# Patient Record
Sex: Male | Born: 1965 | Race: White | Hispanic: No | Marital: Married | Smoking: Current every day smoker
Health system: Southern US, Community
[De-identification: ages and names within clinical notes are randomized; demographics above are authoritative.]

## PROBLEM LIST (undated history)

## (undated) DIAGNOSIS — F32A Depression, unspecified: Secondary | ICD-10-CM

## (undated) DIAGNOSIS — F329 Major depressive disorder, single episode, unspecified: Secondary | ICD-10-CM

## (undated) DIAGNOSIS — N189 Chronic kidney disease, unspecified: Secondary | ICD-10-CM

## (undated) HISTORY — DX: Major depressive disorder, single episode, unspecified: F32.9

## (undated) HISTORY — PX: COLONOSCOPY: SHX174

## (undated) HISTORY — DX: Chronic kidney disease, unspecified: N18.9

## (undated) HISTORY — DX: Depression, unspecified: F32.A

---

## 2000-06-22 ENCOUNTER — Emergency Department (HOSPITAL_COMMUNITY): Admission: EM | Admit: 2000-06-22 | Discharge: 2000-06-22 | Payer: Self-pay | Admitting: Podiatry

## 2000-06-22 ENCOUNTER — Encounter: Payer: Self-pay | Admitting: *Deleted

## 2002-08-27 ENCOUNTER — Emergency Department (HOSPITAL_COMMUNITY): Admission: AD | Admit: 2002-08-27 | Discharge: 2002-08-27 | Payer: Self-pay

## 2004-12-18 ENCOUNTER — Emergency Department (HOSPITAL_COMMUNITY): Admission: EM | Admit: 2004-12-18 | Discharge: 2004-12-18 | Payer: Self-pay | Admitting: Emergency Medicine

## 2006-03-13 ENCOUNTER — Emergency Department (HOSPITAL_COMMUNITY): Admission: EM | Admit: 2006-03-13 | Discharge: 2006-03-13 | Payer: Self-pay | Admitting: Family Medicine

## 2006-10-12 ENCOUNTER — Encounter: Admission: RE | Admit: 2006-10-12 | Discharge: 2006-10-12 | Payer: Self-pay | Admitting: Occupational Medicine

## 2008-05-26 ENCOUNTER — Emergency Department (HOSPITAL_COMMUNITY): Admission: EM | Admit: 2008-05-26 | Discharge: 2008-05-26 | Payer: Self-pay | Admitting: Emergency Medicine

## 2009-03-29 ENCOUNTER — Emergency Department (HOSPITAL_COMMUNITY): Admission: EM | Admit: 2009-03-29 | Discharge: 2009-03-29 | Payer: Self-pay | Admitting: Emergency Medicine

## 2009-10-14 ENCOUNTER — Emergency Department (HOSPITAL_COMMUNITY): Admission: EM | Admit: 2009-10-14 | Discharge: 2009-10-15 | Payer: Self-pay | Admitting: Emergency Medicine

## 2009-10-14 ENCOUNTER — Ambulatory Visit (HOSPITAL_COMMUNITY): Admission: RE | Admit: 2009-10-14 | Discharge: 2009-10-14 | Payer: Self-pay | Admitting: Psychiatry

## 2009-10-15 ENCOUNTER — Ambulatory Visit: Payer: Self-pay | Admitting: Psychiatry

## 2009-10-15 ENCOUNTER — Inpatient Hospital Stay (HOSPITAL_COMMUNITY): Admission: RE | Admit: 2009-10-15 | Discharge: 2009-10-18 | Payer: Self-pay | Admitting: Psychiatry

## 2010-05-13 ENCOUNTER — Institutional Professional Consult (permissible substitution) (INDEPENDENT_AMBULATORY_CARE_PROVIDER_SITE_OTHER): Payer: Self-pay | Admitting: Family Medicine

## 2010-05-13 DIAGNOSIS — E785 Hyperlipidemia, unspecified: Secondary | ICD-10-CM

## 2010-05-13 DIAGNOSIS — R9431 Abnormal electrocardiogram [ECG] [EKG]: Secondary | ICD-10-CM

## 2010-05-13 DIAGNOSIS — F17200 Nicotine dependence, unspecified, uncomplicated: Secondary | ICD-10-CM

## 2010-05-20 LAB — DIFFERENTIAL
Basophils Absolute: 0 10*3/uL (ref 0.0–0.1)
Basophils Relative: 0 % (ref 0–1)
Eosinophils Relative: 1 % (ref 0–5)
Monocytes Absolute: 0.6 10*3/uL (ref 0.1–1.0)
Neutro Abs: 5.7 10*3/uL (ref 1.7–7.7)

## 2010-05-20 LAB — CBC
MCV: 89.1 fL (ref 78.0–100.0)
Platelets: 253 10*3/uL (ref 150–400)
RBC: 4.97 MIL/uL (ref 4.22–5.81)
WBC: 9.1 10*3/uL (ref 4.0–10.5)

## 2010-05-20 LAB — RAPID URINE DRUG SCREEN, HOSP PERFORMED
Amphetamines: NOT DETECTED
Barbiturates: NOT DETECTED
Benzodiazepines: NOT DETECTED
Cocaine: NOT DETECTED
Opiates: NOT DETECTED
Tetrahydrocannabinol: POSITIVE — AB

## 2010-05-20 LAB — URINALYSIS, ROUTINE W REFLEX MICROSCOPIC
Bilirubin Urine: NEGATIVE
Glucose, UA: NEGATIVE mg/dL
Hgb urine dipstick: NEGATIVE
Ketones, ur: NEGATIVE mg/dL
Nitrite: NEGATIVE
Protein, ur: NEGATIVE mg/dL
Specific Gravity, Urine: 1.009 (ref 1.005–1.030)
Urobilinogen, UA: 0.2 mg/dL (ref 0.0–1.0)
pH: 6 (ref 5.0–8.0)

## 2010-05-20 LAB — COMPREHENSIVE METABOLIC PANEL
AST: 17 U/L (ref 0–37)
Albumin: 3.8 g/dL (ref 3.5–5.2)
Alkaline Phosphatase: 57 U/L (ref 39–117)
BUN: 10 mg/dL (ref 6–23)
Chloride: 102 mEq/L (ref 96–112)
Potassium: 3.8 mEq/L (ref 3.5–5.1)
Total Bilirubin: 1.1 mg/dL (ref 0.3–1.2)

## 2010-06-09 ENCOUNTER — Inpatient Hospital Stay (INDEPENDENT_AMBULATORY_CARE_PROVIDER_SITE_OTHER)
Admission: RE | Admit: 2010-06-09 | Discharge: 2010-06-09 | Disposition: A | Discharge status: Home / Self Care | Payer: Self-pay | Source: Ambulatory Visit | Attending: Emergency Medicine | Admitting: Emergency Medicine

## 2010-06-09 DIAGNOSIS — K047 Periapical abscess without sinus: Secondary | ICD-10-CM

## 2010-06-11 ENCOUNTER — Inpatient Hospital Stay (HOSPITAL_COMMUNITY)
Admission: RE | Admit: 2010-06-11 | Discharge: 2010-06-11 | Disposition: A | Discharge status: Home / Self Care | Payer: Self-pay | Source: Ambulatory Visit | Attending: Emergency Medicine | Admitting: Emergency Medicine

## 2010-07-09 ENCOUNTER — Inpatient Hospital Stay (INDEPENDENT_AMBULATORY_CARE_PROVIDER_SITE_OTHER)
Admission: RE | Admit: 2010-07-09 | Discharge: 2010-07-09 | Disposition: A | Discharge status: Home / Self Care | Payer: Self-pay | Source: Ambulatory Visit | Attending: Family Medicine | Admitting: Family Medicine

## 2010-07-09 DIAGNOSIS — K029 Dental caries, unspecified: Secondary | ICD-10-CM

## 2010-07-09 DIAGNOSIS — K0510 Chronic gingivitis, plaque induced: Secondary | ICD-10-CM

## 2010-09-13 ENCOUNTER — Encounter: Payer: Self-pay | Admitting: Family Medicine

## 2010-09-15 ENCOUNTER — Other Ambulatory Visit: Payer: Self-pay | Admitting: Family Medicine

## 2010-09-15 ENCOUNTER — Encounter: Payer: Self-pay | Admitting: Family Medicine

## 2010-09-15 ENCOUNTER — Ambulatory Visit (INDEPENDENT_AMBULATORY_CARE_PROVIDER_SITE_OTHER): Payer: BC Managed Care – PPO | Admitting: Family Medicine

## 2010-09-15 VITALS — BP 110/70 | HR 76 | Wt 151.0 lb

## 2010-09-15 DIAGNOSIS — Q613 Polycystic kidney, unspecified: Secondary | ICD-10-CM

## 2010-09-15 DIAGNOSIS — R6882 Decreased libido: Secondary | ICD-10-CM

## 2010-09-15 DIAGNOSIS — R5383 Other fatigue: Secondary | ICD-10-CM

## 2010-09-15 DIAGNOSIS — R5381 Other malaise: Secondary | ICD-10-CM

## 2010-09-15 LAB — CBC WITH DIFFERENTIAL/PLATELET
Basophils Absolute: 0.1 10*3/uL (ref 0.0–0.1)
Basophils Relative: 1 % (ref 0–1)
MCHC: 33.9 g/dL (ref 30.0–36.0)
Neutro Abs: 3.9 10*3/uL (ref 1.7–7.7)
Neutrophils Relative %: 56 % (ref 43–77)
Platelets: 239 10*3/uL (ref 150–400)
RDW: 14.4 % (ref 11.5–15.5)

## 2010-09-15 LAB — T4: T4, Total: 7.8 ug/dL (ref 5.0–12.5)

## 2010-09-15 LAB — POCT URINALYSIS DIPSTICK
Glucose, UA: NEGATIVE
Leukocytes, UA: NEGATIVE
Spec Grav, UA: 1.015
Urobilinogen, UA: NEGATIVE

## 2010-09-15 LAB — COMPREHENSIVE METABOLIC PANEL
AST: 18 U/L (ref 0–37)
Albumin: 4.4 g/dL (ref 3.5–5.2)
Alkaline Phosphatase: 62 U/L (ref 39–117)
Potassium: 4.6 mEq/L (ref 3.5–5.3)
Sodium: 138 mEq/L (ref 135–145)
Total Protein: 6.7 g/dL (ref 6.0–8.3)

## 2010-09-15 NOTE — Patient Instructions (Signed)
I will call you with the results of the blood work and whether we need to do an ultrasound on your kidneys.

## 2010-09-15 NOTE — Progress Notes (Signed)
  Subjective:    Patient ID: Gary Duke, male    DOB: Aug 08, 1965, 45 y.o.   MRN: [id number]  HPI He is here for consultation. He states he was diagnosed with polycystic kidneys at age 76. Apparently his mother and one other sibling have also been diagnosed with this. He has not had followup since that time. There is also family history of thyroid disease in mother and brother. He does complain of fatigue, thickening of his hair, cold intolerance, constipation. He also has noted decreased libido. He does smoke.   Review of Systems Negative except as above    Objective:   Physical Exam alert and in no distress. Tympanic membranes and canals are normal. Throat is clear. Tonsils are normal. Neck is supple without adenopathy or thyromegaly. Cardiac exam shows a regular sinus rhythm without murmurs or gallops. Lungs are clear to auscultation. Skin and hair are normal. DTRs are 1-2+. Poor dentition is noted.       Assessment & Plan:  History of polycystic kidneys. Fatigue. Decreased libido. Poor dentition. I will do routine blood screening including testosterone and thyroid function. Encouraged him to see his dentist. Maryclare Labrador also order an ultrasound to reevaluate his renal status. Case was discussed with Dr. Hyman Hopes.

## 2010-09-16 ENCOUNTER — Telehealth: Payer: Self-pay

## 2010-09-16 NOTE — Telephone Encounter (Signed)
Pt informed

## 2010-09-19 ENCOUNTER — Ambulatory Visit
Admission: RE | Admit: 2010-09-19 | Discharge: 2010-09-19 | Disposition: A | Discharge status: Home / Self Care | Payer: BC Managed Care – PPO | Source: Ambulatory Visit | Attending: Family Medicine | Admitting: Family Medicine

## 2010-09-19 ENCOUNTER — Telehealth: Payer: Self-pay

## 2010-09-19 DIAGNOSIS — Q613 Polycystic kidney, unspecified: Secondary | ICD-10-CM

## 2010-09-19 NOTE — Telephone Encounter (Signed)
Left message for pt to call me back 

## 2011-01-25 ENCOUNTER — Ambulatory Visit: Payer: BC Managed Care – PPO | Admitting: Family Medicine

## 2012-06-26 ENCOUNTER — Inpatient Hospital Stay (HOSPITAL_COMMUNITY)
Admission: EM | Admit: 2012-06-26 | Discharge: 2012-06-28 | DRG: 813 | Disposition: A | Discharge status: Home / Self Care | Payer: BC Managed Care – HMO | Attending: Internal Medicine | Admitting: Internal Medicine

## 2012-06-26 ENCOUNTER — Encounter (HOSPITAL_COMMUNITY): Payer: Self-pay | Admitting: Emergency Medicine

## 2012-06-26 ENCOUNTER — Emergency Department (HOSPITAL_COMMUNITY): Payer: BC Managed Care – HMO

## 2012-06-26 DIAGNOSIS — E876 Hypokalemia: Secondary | ICD-10-CM | POA: Diagnosis present

## 2012-06-26 DIAGNOSIS — K5289 Other specified noninfective gastroenteritis and colitis: Principal | ICD-10-CM | POA: Diagnosis present

## 2012-06-26 DIAGNOSIS — K529 Noninfective gastroenteritis and colitis, unspecified: Secondary | ICD-10-CM | POA: Diagnosis present

## 2012-06-26 DIAGNOSIS — D72829 Elevated white blood cell count, unspecified: Secondary | ICD-10-CM | POA: Diagnosis present

## 2012-06-26 DIAGNOSIS — Q613 Polycystic kidney, unspecified: Secondary | ICD-10-CM

## 2012-06-26 DIAGNOSIS — F17200 Nicotine dependence, unspecified, uncomplicated: Secondary | ICD-10-CM | POA: Diagnosis present

## 2012-06-26 DIAGNOSIS — E860 Dehydration: Secondary | ICD-10-CM | POA: Diagnosis present

## 2012-06-26 DIAGNOSIS — R109 Unspecified abdominal pain: Secondary | ICD-10-CM | POA: Diagnosis present

## 2012-06-26 LAB — CBC WITH DIFFERENTIAL/PLATELET
Basophils Absolute: 0 10*3/uL (ref 0.0–0.1)
Basophils Relative: 0 % (ref 0–1)
HCT: 41.3 % (ref 39.0–52.0)
Lymphocytes Relative: 13 % (ref 12–46)
MCHC: 35.6 g/dL (ref 30.0–36.0)
Neutro Abs: 9.2 10*3/uL — ABNORMAL HIGH (ref 1.7–7.7)
Neutrophils Relative %: 80 % — ABNORMAL HIGH (ref 43–77)
Platelets: 237 10*3/uL (ref 150–400)
RDW: 13.7 % (ref 11.5–15.5)
WBC: 11.5 10*3/uL — ABNORMAL HIGH (ref 4.0–10.5)

## 2012-06-26 LAB — COMPREHENSIVE METABOLIC PANEL
ALT: 11 U/L (ref 0–53)
AST: 20 U/L (ref 0–37)
Albumin: 3.9 g/dL (ref 3.5–5.2)
CO2: 24 mEq/L (ref 19–32)
Chloride: 98 mEq/L (ref 96–112)
GFR calc non Af Amer: 84 mL/min — ABNORMAL LOW (ref >90)
Potassium: 3.3 mEq/L — ABNORMAL LOW (ref 3.5–5.1)
Sodium: 135 mEq/L (ref 135–145)
Total Bilirubin: 0.7 mg/dL (ref 0.3–1.2)

## 2012-06-26 LAB — LIPASE, BLOOD: Lipase: 26 U/L (ref 11–59)

## 2012-06-26 LAB — URINALYSIS, ROUTINE W REFLEX MICROSCOPIC
Glucose, UA: NEGATIVE mg/dL
Leukocytes, UA: NEGATIVE
Nitrite: NEGATIVE
Protein, ur: 30 mg/dL — AB
Urobilinogen, UA: 1 mg/dL (ref 0.0–1.0)

## 2012-06-26 LAB — URINE MICROSCOPIC-ADD ON

## 2012-06-26 MED ORDER — POTASSIUM CHLORIDE CRYS ER 20 MEQ PO TBCR
40.0000 meq | EXTENDED_RELEASE_TABLET | Freq: Once | ORAL | Status: AC
  Administered 2012-06-26: 40 meq via ORAL
  Filled 2012-06-26: qty 2

## 2012-06-26 MED ORDER — HYDROMORPHONE HCL PF 1 MG/ML IJ SOLN: 1.0000 mg | INTRAMUSCULAR | Status: DC | PRN

## 2012-06-26 MED ORDER — HYDROMORPHONE HCL PF 1 MG/ML IJ SOLN: 1.0000 mg | INTRAMUSCULAR | Status: AC | PRN

## 2012-06-26 MED ORDER — HYDROCODONE-ACETAMINOPHEN 5-325 MG PO TABS: 1.0000 | ORAL_TABLET | ORAL | Status: DC | PRN

## 2012-06-26 MED ORDER — ONDANSETRON HCL 4 MG PO TABS: 4.0000 mg | ORAL_TABLET | Freq: Four times a day (QID) | ORAL | Status: DC | PRN

## 2012-06-26 MED ORDER — ACETAMINOPHEN 325 MG PO TABS: 650.0000 mg | ORAL_TABLET | Freq: Four times a day (QID) | ORAL | Status: DC | PRN

## 2012-06-26 MED ORDER — SODIUM CHLORIDE 0.9 % IV SOLN
INTRAVENOUS | Status: AC
  Administered 2012-06-26: 12:00:00 via INTRAVENOUS

## 2012-06-26 MED ORDER — IOHEXOL 300 MG/ML  SOLN
100.0000 mL | Freq: Once | INTRAMUSCULAR | Status: AC | PRN
  Administered 2012-06-26: 100 mL via INTRAVENOUS

## 2012-06-26 MED ORDER — HYDROMORPHONE HCL PF 1 MG/ML IJ SOLN
1.0000 mg | INTRAMUSCULAR | Status: DC | PRN
  Administered 2012-06-26: 1 mg via INTRAVENOUS
  Filled 2012-06-26: qty 1

## 2012-06-26 MED ORDER — CIPROFLOXACIN IN D5W 400 MG/200ML IV SOLN
400.0000 mg | Freq: Two times a day (BID) | INTRAVENOUS | Status: DC
  Administered 2012-06-26 – 2012-06-28 (×4): 400 mg via INTRAVENOUS
  Filled 2012-06-26 (×5): qty 200

## 2012-06-26 MED ORDER — SODIUM CHLORIDE 0.9 % IV SOLN
INTRAVENOUS | Status: DC
  Administered 2012-06-26 – 2012-06-28 (×4): via INTRAVENOUS

## 2012-06-26 MED ORDER — ACETAMINOPHEN 650 MG RE SUPP: 650.0000 mg | Freq: Four times a day (QID) | RECTAL | Status: DC | PRN

## 2012-06-26 MED ORDER — ONDANSETRON HCL 4 MG/2ML IJ SOLN: 4.0000 mg | Freq: Three times a day (TID) | INTRAMUSCULAR | Status: AC | PRN

## 2012-06-26 MED ORDER — ENOXAPARIN SODIUM 40 MG/0.4ML ~~LOC~~ SOLN
40.0000 mg | SUBCUTANEOUS | Status: DC
  Administered 2012-06-26 – 2012-06-27 (×2): 40 mg via SUBCUTANEOUS
  Filled 2012-06-26 (×4): qty 0.4

## 2012-06-26 MED ORDER — ONDANSETRON HCL 4 MG/2ML IJ SOLN
4.0000 mg | Freq: Once | INTRAMUSCULAR | Status: AC
  Administered 2012-06-26: 4 mg via INTRAVENOUS

## 2012-06-26 MED ORDER — METRONIDAZOLE IN NACL 5-0.79 MG/ML-% IV SOLN
500.0000 mg | Freq: Three times a day (TID) | INTRAVENOUS | Status: DC
  Administered 2012-06-26 – 2012-06-28 (×5): 500 mg via INTRAVENOUS
  Filled 2012-06-26 (×8): qty 100

## 2012-06-26 MED ORDER — CIPROFLOXACIN IN D5W 400 MG/200ML IV SOLN
400.0000 mg | Freq: Once | INTRAVENOUS | Status: AC
  Administered 2012-06-26: 400 mg via INTRAVENOUS
  Filled 2012-06-26: qty 200

## 2012-06-26 MED ORDER — ONDANSETRON HCL 4 MG/2ML IJ SOLN
4.0000 mg | Freq: Once | INTRAMUSCULAR | Status: DC
  Filled 2012-06-26: qty 2

## 2012-06-26 MED ORDER — POTASSIUM CHLORIDE 20 MEQ/15ML (10%) PO LIQD
40.0000 meq | Freq: Once | ORAL | Status: AC
  Administered 2012-06-26: 40 meq via ORAL
  Filled 2012-06-26: qty 30

## 2012-06-26 MED ORDER — IOHEXOL 300 MG/ML  SOLN
50.0000 mL | Freq: Once | INTRAMUSCULAR | Status: AC | PRN
  Administered 2012-06-26: 50 mL via ORAL

## 2012-06-26 MED ORDER — METRONIDAZOLE IN NACL 5-0.79 MG/ML-% IV SOLN
500.0000 mg | Freq: Once | INTRAVENOUS | Status: AC
  Administered 2012-06-26: 500 mg via INTRAVENOUS
  Filled 2012-06-26: qty 100

## 2012-06-26 MED ORDER — ONDANSETRON HCL 4 MG/2ML IJ SOLN: 4.0000 mg | Freq: Four times a day (QID) | INTRAMUSCULAR | Status: DC | PRN

## 2012-06-26 MED ORDER — HYDROMORPHONE HCL PF 1 MG/ML IJ SOLN
1.0000 mg | Freq: Once | INTRAMUSCULAR | Status: AC
  Administered 2012-06-26: 1 mg via INTRAVENOUS
  Filled 2012-06-26: qty 1

## 2012-06-26 MED ORDER — ALUM & MAG HYDROXIDE-SIMETH 200-200-20 MG/5ML PO SUSP: 30.0000 mL | Freq: Four times a day (QID) | ORAL | Status: DC | PRN

## 2012-06-26 NOTE — Progress Notes (Signed)
Patient admitted to Unit 5E, alert and oriented, transferred by wheelchair by primary nurse from ED, tolerated well, admitted to room 1520, patient c/o pain in abdomen rated 3/10,

## 2012-06-26 NOTE — ED Notes (Signed)
hospitalist at bedside

## 2012-06-26 NOTE — ED Notes (Signed)
Patient transported to CT and returned 

## 2012-06-26 NOTE — ED Notes (Signed)
Bed:WA21<BR> Expected date:<BR> Expected time:<BR> Means of arrival:<BR> Comments:<BR> ems

## 2012-06-26 NOTE — ED Notes (Signed)
MD at bedside. 

## 2012-06-26 NOTE — ED Notes (Signed)
Patient transported to X-ray 

## 2012-06-26 NOTE — ED Notes (Signed)
Today 0030 onset generalized abdominal pain. Progressively worsened. 1 episode of vomiting per pt. 12-lead EKG done. Per EMS.

## 2012-06-26 NOTE — H&P (Signed)
[facility] History and Physical  Gary Duke ZOX:[id number] DOB: 1965/03/27 DOA: 06/26/2012  Referring physician: Marisa Severin PCP: Carollee Herter, MD  Specialists: none  Chief Complaint: abdominal pain  HPI: Gary Duke is a 47 y.o. male  This is a 47 year old male with a history of polycystic kidney disease who presents with a complaint of abdominal pain. The patient was finishing off from work when he noted the pain close to 12 midnight. The pain is diffuse across the abdomen and at one point radiated around to his back for a couple of minutes. He describes it as crampy and burning about 8/10 in severity. He's not aware of any exacerbating factors. Pain improved with pain medication in the ER. He did have some nausea and vomited a small amount of light green liquid last night. He has not had any further vomiting and was able to drink the contrast given to him for a CT scan of the abdomen - has not had any diarrhea. No complaints of fevers chills or sweats either. He does not recall eating anything out of the ordinary-he usually takes frozen foods to eat at work.  Review of Systems: The patient denies anorexia, fever, weight loss,, vision loss, decreased hearing, hoarseness, chest pain, syncope, dyspnea on exertion, peripheral edema, balance deficits, hemoptysis, melena, hematochezia, severe indigestion/heartburn, hematuria, incontinence, genital sores, muscle weakness, suspicious skin lesions, transient blindness, difficulty walking, depression, unusual weight change, abnormal bleeding, enlarged lymph nodes, angioedema, and breast masses.    Past Medical History  Diagnosis Date  . Chronic kidney disease    History reviewed. No pertinent past surgical history. Social History:  reports that he has been smoking Cigarettes.  He has been smoking about 1.50 packs per day. He has never used smokeless tobacco. He reports that he does not use illicit drugs. His alcohol history is not  on file.   Allergies  Allergen Reactions  . Amoxicillin  rash     History reviewed. No pertinent family history. his mother has a history of polycystic kidney disease and diabetes-grandfather had heart disease-he is not aware of any medical problems in his follow  Prior to Admission medications   Medication Sig Start Date End Date Taking? Authorizing Provider  ibuprofen (ADVIL,MOTRIN) 200 MG tablet Take 200 mg by mouth every 6 (six) hours as needed for pain.    Yes Historical Provider, MD   Physical Exam: Filed Vitals:   06/26/12 0315 06/26/12 0430 06/26/12 0500 06/26/12 0612  BP: 143/86 152/80 146/78 123/74  Pulse: 94 98 94 97  Temp:      TempSrc:      Resp: 29 19 19    SpO2: 94% 100% 98% 95%     General:  Awake alert oriented x3, no acute distress  Eyes: Pupils equal round reactive to light, conjunctiva pink, no scleral icterus  ENT: Oral mucosa is moist, normal dentition  Neck: Supple, no lymphadenopathy, trachea midline, no thyromegaly  Cardiovascular: Regular rate and rhythm, no murmurs rubs or gallops  Respiratory: Clear to auscultation bilaterally  Abdomen: Soft, diffusely tender with maximum point of tenderness in the epigastrium, bowel sounds are positive but decreased, nondistended, no organomegaly, no rebound tenderness  Skin: Warm, dry, no rash  Musculoskeletal: No joint swelling  Psychiatric: Mood and affect normal  Neurologic: Cranial nerves II through XII intact, strength intact in all 4 extremities  Labs on Admission:  Basic Metabolic Panel:  Recent Labs Lab 06/26/12 0250  NA 135  K 3.3*  CL 98  CO2 24  GLUCOSE 150*  BUN 18  CREATININE 1.04  CALCIUM 9.6   Liver Function Tests:  Recent Labs Lab 06/26/12 0250  AST 20  ALT 11  ALKPHOS 65  BILITOT 0.7  PROT 6.9  ALBUMIN 3.9    Recent Labs Lab 06/26/12 0250  LIPASE 26   No results found for this basename: AMMONIA,  in the last 168 hours CBC:  Recent Labs Lab 06/26/12 0250   WBC 11.5*  NEUTROABS 9.2*  HGB 14.7  HCT 41.3  MCV 85.0  PLT 237   Cardiac Enzymes: No results found for this basename: CKTOTAL, CKMB, CKMBINDEX, TROPONINI,  in the last 168 hours  BNP (last 3 results) No results found for this basename: PROBNP,  in the last 8760 hours CBG: No results found for this basename: GLUCAP,  in the last 168 hours  Radiological Exams on Admission: Ct Abdomen Pelvis W Contrast  06/26/2012  *RADIOLOGY REPORT*  Clinical Data: Severe abdominal pain  CT ABDOMEN AND PELVIS WITH CONTRAST  Technique:  Multidetector CT imaging of the abdomen and pelvis was performed following the standard protocol during bolus administration of intravenous contrast.  Contrast: OMNIPAQUE IOHEXOL 300 MG/ML  SOLN  Comparison: 06/26/2012 radiographs  Findings: Lung bases clear.  Heart size within normal range.  Multiple hypodensities scattered throughout the liver, favored to reflect cysts.  Unremarkable biliary system, spleen, adrenal glands.  Punctate calcification along the body of the pancreas may reflect sequelae of prior pancreatitis.  Homogeneous pancreatic enhancement.  No peripancreatic fat stranding at this time to suggest acute inflammation.  There are numerous bilateral renal cysts and incompletely characterize hypodensities.  Calcifications left kidney may reflect stones.  No hydronephrosis or hydroureter.  Decompressed colon.  No CT evidence for colitis.  Normal appendix. There is a small amount of fluid within the right lower quadrant, periappendiceal and right paracolic gutter abutting the cecum/descending colon.  No free intraperitoneal air.  There are thickened jejunal loops.  Decompressed distal bowel.  Normal caliber aorta and branch vessels with scattered atherosclerotic disease.  Patent portal vessels.  Circumferential bladder wall thickening nonspecific.  No acute osseous finding.  IMPRESSION: Thickened jejunal loops may reflect a nonspecific enteritis (infectious,  inflammatory, ischemic considerations).  Small amount of free fluid within the right lower quadrant is nonspecific.  The appendix is normal.  Polycystic kidneys and nonobstructing left renal stones.   Original Report Authenticated By: Jearld Lesch, M.D.    Dg Abd Acute W/chest  06/26/2012  *RADIOLOGY REPORT*  Clinical Data: Mid abdominal pain  ACUTE ABDOMEN SERIES (ABDOMEN 2 VIEW & CHEST 1 VIEW)  Comparison: 03/13/2006 chest radiograph  Findings: No focal consolidation.  Cardiomediastinal contours within normal range.  Relative paucity of bowel gas.  No free intraperitoneal air.  No acute osseous finding.  IMPRESSION: Nonspecific bowel gas pattern without overt evidence for obstruction.   Original Report Authenticated By: Jearld Lesch, M.D.     EKG: Independently reviewed. 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

## 2012-06-26 NOTE — ED Notes (Signed)
Pt has a urinal at bedside and is aware of the need for urine sample.

## 2012-06-26 NOTE — Progress Notes (Signed)
Took report for admission of this 47 year old gentleman who presented at midnight with abdominal pain and found to have thickened jejunal loops(no specific). BP 146/78  Pulse 94  Temp(Src) 97.8 F (36.6 C) (Oral)  Resp 19  SpO2 98% CBC with elevated WBC with left shift. Hospitalist team asked to admit the patient for IV antibiotic therapy.   Gary Mage MD Faculty-Internal Medicine [facility]

## 2012-06-26 NOTE — ED Provider Notes (Signed)
History     CSN: [id number]  Arrival date & time 06/26/12  0233   First MD Initiated Contact with Patient 06/26/12 0249      Chief Complaint  Patient presents with  . Abdominal Pain    (Consider location/radiation/quality/duration/timing/severity/associated sxs/prior treatment) HPI  47 yo male presents to the ER via EMS from home with complaint of severe abdominal pain, n/v.  Pain started at around 1230 after getting off work.  Pain across upper abdomen and radiation around into his back bilaterally.  Pain worse with movement, deep breaths, pt staying very still in the bed, shallow respirations.  No prior h/o same.  No fevers, chills.  No heavy lfitng or history of hernia.  No h/o GERD/ulcers.  Pt smokes 1.5 ppd.     Past Medical History  Diagnosis Date  . Chronic kidney disease     History reviewed. No pertinent past surgical history.  History reviewed. No pertinent family history.  History  Substance Use Topics  . Smoking status: Current Every Day Smoker -- 1.50 packs/day    Types: Cigarettes  . Smokeless tobacco: Never Used  . Alcohol Use: Not on file      Review of Systems  All other systems reviewed and are negative.    Allergies  Amoxicillin  Home Medications   Current Outpatient Rx  Name  Route  Sig  Dispense  Refill  . ibuprofen (ADVIL,MOTRIN) 200 MG tablet   Oral   Take 200 mg by mouth every 6 (six) hours as needed for pain.            BP 162/82  Pulse 92  Temp(Src) 97.8 F (36.6 C) (Oral)  Resp 26  SpO2 99%  Physical Exam  Constitutional: He is oriented to person, place, and time. He appears distressed.  Pale, lying rigid in bed, in obvious pain  HENT:  Head: Normocephalic and atraumatic.  Nose: Nose normal.  Mouth/Throat: Oropharynx is clear and moist.  Neck: Normal range of motion. Neck supple. No JVD present. No tracheal deviation present. No thyromegaly present.  Pulmonary/Chest: Breath sounds normal. No stridor. No respiratory  distress. He has no wheezes. He has no rales. He exhibits no tenderness.  Shallow respirations  Abdominal: He exhibits no distension. There is tenderness. There is rebound and guarding.  Decreased abd pain. Rigid abdomen.  +peritoneal signs.    Musculoskeletal: Normal range of motion. He exhibits no edema and no tenderness.  Lymphadenopathy:    He has no cervical adenopathy.  Neurological: He is alert and oriented to person, place, and time.  Skin: Skin is warm and dry. No rash noted. No erythema. There is pallor.    ED Course  Procedures (including critical care time)  Labs Reviewed  URINALYSIS, ROUTINE W REFLEX MICROSCOPIC - Abnormal; Notable for the following:    Ketones, ur 15 (*)    Protein, ur 30 (*)    All other components within normal limits  CBC WITH DIFFERENTIAL - Abnormal; Notable for the following:    WBC 11.5 (*)    Neutrophils Relative 80 (*)    Neutro Abs 9.2 (*)    All other components within normal limits  COMPREHENSIVE METABOLIC PANEL - Abnormal; Notable for the following:    Potassium 3.3 (*)    Glucose, Bld 150 (*)    GFR calc non Af Amer 84 (*)    All other components within normal limits  LACTIC ACID, PLASMA  LIPASE, BLOOD  URINE MICROSCOPIC-ADD ON   Ct Abdomen  Pelvis W Contrast  06/26/2012  *RADIOLOGY REPORT*  Clinical Data: Severe abdominal pain  CT ABDOMEN AND PELVIS WITH CONTRAST  Technique:  Multidetector CT imaging of the abdomen and pelvis was performed following the standard protocol during bolus administration of intravenous contrast.  Contrast: OMNIPAQUE IOHEXOL 300 MG/ML  SOLN  Comparison: 06/26/2012 radiographs  Findings: Lung bases clear.  Heart size within normal range.  Multiple hypodensities scattered throughout the liver, favored to reflect cysts.  Unremarkable biliary system, spleen, adrenal glands.  Punctate calcification along the body of the pancreas may reflect sequelae of prior pancreatitis.  Homogeneous pancreatic enhancement.  No  peripancreatic fat stranding at this time to suggest acute inflammation.  There are numerous bilateral renal cysts and incompletely characterize hypodensities.  Calcifications left kidney may reflect stones.  No hydronephrosis or hydroureter.  Decompressed colon.  No CT evidence for colitis.  Normal appendix. There is a small amount of fluid within the right lower quadrant, periappendiceal and right paracolic gutter abutting the cecum/descending colon.  No free intraperitoneal air.  There are thickened jejunal loops.  Decompressed distal bowel.  Normal caliber aorta and branch vessels with scattered atherosclerotic disease.  Patent portal vessels.  Circumferential bladder wall thickening nonspecific.  No acute osseous finding.  IMPRESSION: Thickened jejunal loops may reflect a nonspecific enteritis (infectious, inflammatory, ischemic considerations).  Small amount of free fluid within the right lower quadrant is nonspecific.  The appendix is normal.  Polycystic kidneys and nonobstructing left renal stones.   Original Report Authenticated By: Jearld Lesch, M.D.    Dg Abd Acute W/chest  06/26/2012  *RADIOLOGY REPORT*  Clinical Data: Mid abdominal pain  ACUTE ABDOMEN SERIES (ABDOMEN 2 VIEW & CHEST 1 VIEW)  Comparison: 03/13/2006 chest radiograph  Findings: No focal consolidation.  Cardiomediastinal contours within normal range.  Relative paucity of bowel gas.  No free intraperitoneal air.  No acute osseous finding.  IMPRESSION: Nonspecific bowel gas pattern without overt evidence for obstruction.   Original Report Authenticated By: Jearld Lesch, M.D.      1. Enteritis presumed infectious       MDM  47 year old male with acute severe abdominal pain with signs of peritonitis.  Acute abdominal series without free air noted.  Labs are pending.  Plan for CT abdomen and close surgical consult if needed.    5:55 AM Ct scan shows enteritis, no signs of perforation, appendicitis.  Pt re-examined, more  comfortable after dilaudid, abd still firm but not rigid.  Will start abx, d/w hospitalist for admisson.    Olivia Mackie, MD 06/26/12 [phone]

## 2012-06-27 DIAGNOSIS — E860 Dehydration: Secondary | ICD-10-CM

## 2012-06-27 DIAGNOSIS — R109 Unspecified abdominal pain: Secondary | ICD-10-CM | POA: Diagnosis present

## 2012-06-27 LAB — CBC
HCT: 38.5 % — ABNORMAL LOW (ref 39.0–52.0)
Hemoglobin: 13 g/dL (ref 13.0–17.0)
MCH: 29.7 pg (ref 26.0–34.0)
MCHC: 33.8 g/dL (ref 30.0–36.0)
MCV: 87.9 fL (ref 78.0–100.0)
RDW: 13.9 % (ref 11.5–15.5)

## 2012-06-27 LAB — BASIC METABOLIC PANEL
BUN: 14 mg/dL (ref 6–23)
CO2: 28 mEq/L (ref 19–32)
Chloride: 106 mEq/L (ref 96–112)
Creatinine, Ser: 1.01 mg/dL (ref 0.50–1.35)
GFR calc Af Amer: >90 mL/min (ref >90)
Glucose, Bld: 79 mg/dL (ref 70–99)
Potassium: 4.7 mEq/L (ref 3.5–5.1)

## 2012-06-27 MED ORDER — PANTOPRAZOLE SODIUM 40 MG PO TBEC
40.0000 mg | DELAYED_RELEASE_TABLET | Freq: Every day | ORAL | Status: DC
  Administered 2012-06-27 – 2012-06-28 (×2): 40 mg via ORAL
  Filled 2012-06-27 (×2): qty 1

## 2012-06-27 NOTE — Progress Notes (Signed)
[facility] PROGRESS NOTE  Gary Duke YNW:[id number] DOB: 1965-07-13 DOA: 06/26/2012 PCP: Carollee Herter, MD  Assessment/Plan: #1 abdominal pain  Likely secondary to enteritis. CT of the abdomen and pelvis with thickened jejunal loops. Patient with clinical improvement. Patient with no diarrhea. Continue empiric IV ciprofloxacin and IV Flagyl. Continue supportive care. Advance diet to full liquid diet and advance as tolerated. Place on PPI. Follow.  #2 enteritis See problem #1.  #3 hypokalemia Repleted.  #4 dehydration IV fluids.  #5 prophylaxis PPI for GI prophylaxis. Lovenox for DVT prophylaxis.  Code Status: Full Family Communication: Updated patient no family at bedside. Disposition Plan: Home when medically stable in 1-2 days   Consultants:  None  Procedures:  CT of the abdomen and pelvis 06/26/2012  Antibiotics:  IV ciprofloxacin 06/26/2012  IV Flagyl 06/26/2012  HPI/Subjective: Patient states abdominal pain improved. No nausea, no diarrhea. Tolerating clear liquids.  Objective: Filed Vitals:   06/26/12 0852 06/26/12 1522 06/26/12 2124 06/27/12 0608  BP: 134/69 100/57 117/65 118/64  Pulse: 90 83 77 62  Temp: 98.7 F (37.1 C) 98.9 F (37.2 C) 98.6 F (37 C) 97.9 F (36.6 C)  TempSrc: Oral Oral Oral Oral  Resp: 20 20 18 18   Height: 6\' 2"  (1.88 m)     Weight: 69.446 kg (153 lb 1.6 oz)     SpO2: 99% 99% 97% 98%    Intake/Output Summary (Last 24 hours) at 06/27/12 0938 Last data filed at 06/26/12 1817  Gross per 24 hour  Intake    480 ml  Output      0 ml  Net    480 ml   Filed Weights   06/26/12 0825 06/26/12 0852  Weight: 70.308 kg (155 lb) 69.446 kg (153 lb 1.6 oz)    Exam:   General:  NAD  Cardiovascular: RRR  Respiratory: CTAB  Abdomen: SOFT/nt/nd/+bs  Musculoskeletal: 5/5 BUE strength, 5/5 BLE strength  Extremities: No c/c/e   Data Reviewed: Basic Metabolic Panel:  Recent Labs Lab 06/26/12 0250  06/27/12 0549  NA 135 139  K 3.3* 4.7  CL 98 106  CO2 24 28  GLUCOSE 150* 79  BUN 18 14  CREATININE 1.04 1.01  CALCIUM 9.6 9.0   Liver Function Tests:  Recent Labs Lab 06/26/12 0250  AST 20  ALT 11  ALKPHOS 65  BILITOT 0.7  PROT 6.9  ALBUMIN 3.9    Recent Labs Lab 06/26/12 0250  LIPASE 26   No results found for this basename: AMMONIA,  in the last 168 hours CBC:  Recent Labs Lab 06/26/12 0250 06/27/12 0549  WBC 11.5* 7.0  NEUTROABS 9.2*  --   HGB 14.7 13.0  HCT 41.3 38.5*  MCV 85.0 87.9  PLT 237 195   Cardiac Enzymes: No results found for this basename: CKTOTAL, CKMB, CKMBINDEX, TROPONINI,  in the last 168 hours BNP (last 3 results) No results found for this basename: PROBNP,  in the last 8760 hours CBG: No results found for this basename: GLUCAP,  in the last 168 hours  No results found for this or any previous visit (from the past 240 hour(s)).   Studies: Ct Abdomen Pelvis W Contrast  06/26/2012  *RADIOLOGY REPORT*  Clinical Data: Severe abdominal pain  CT ABDOMEN AND PELVIS WITH CONTRAST  Technique:  Multidetector CT imaging of the abdomen and pelvis was performed following the standard protocol during bolus administration of intravenous contrast.  Contrast: OMNIPAQUE IOHEXOL 300 MG/ML  SOLN  Comparison: 06/26/2012 radiographs  Findings: Lung bases clear.  Heart size within normal range.  Multiple hypodensities scattered throughout the liver, favored to reflect cysts.  Unremarkable biliary system, spleen, adrenal glands.  Punctate calcification along the body of the pancreas may reflect sequelae of prior pancreatitis.  Homogeneous pancreatic enhancement.  No peripancreatic fat stranding at this time to suggest acute inflammation.  There are numerous bilateral renal cysts and incompletely characterize hypodensities.  Calcifications left kidney may reflect stones.  No hydronephrosis or hydroureter.  Decompressed colon.  No CT evidence for colitis.  Normal  appendix. There is a small amount of fluid within the right lower quadrant, periappendiceal and right paracolic gutter abutting the cecum/descending colon.  No free intraperitoneal air.  There are thickened jejunal loops.  Decompressed distal bowel.  Normal caliber aorta and branch vessels with scattered atherosclerotic disease.  Patent portal vessels.  Circumferential bladder wall thickening nonspecific.  No acute osseous finding.  IMPRESSION: Thickened jejunal loops may reflect a nonspecific enteritis (infectious, inflammatory, ischemic considerations).  Small amount of free fluid within the right lower quadrant is nonspecific.  The appendix is normal.  Polycystic kidneys and nonobstructing left renal stones.   Original Report Authenticated By: Jearld Lesch, M.D.    Dg Abd Acute W/chest  06/26/2012  *RADIOLOGY REPORT*  Clinical Data: Mid abdominal pain  ACUTE ABDOMEN SERIES (ABDOMEN 2 VIEW & CHEST 1 VIEW)  Comparison: 03/13/2006 chest radiograph  Findings: No focal consolidation.  Cardiomediastinal contours within normal range.  Relative paucity of bowel gas.  No free intraperitoneal air.  No acute osseous finding.  IMPRESSION: Nonspecific bowel gas pattern without overt evidence for obstruction.   Original Report Authenticated By: Jearld Lesch, M.D.     Scheduled Meds: . ciprofloxacin  400 mg Intravenous Q12H  . enoxaparin (LOVENOX) injection  40 mg Subcutaneous Q24H  . metronidazole  500 mg Intravenous Q8H  . ondansetron  4 mg Intravenous Once  . pantoprazole  40 mg Oral Q0600   Continuous Infusions: . sodium chloride 125 mL/hr at 06/27/12 4098    Principal Problem:   Abdominal  pain, other specified site Active Problems:   Polycystic kidney disease   Enteritis   Hypokalemia   Dehydration    Time spent: >30 mins    [facility]  [facility] Pager [phone]. If 7PM-7AM, please contact night-coverage at [email or web address], password [facility] 06/27/2012, 9:38 AM  LOS: 1 day

## 2012-06-28 LAB — CBC
Hemoglobin: 12.8 g/dL — ABNORMAL LOW (ref 13.0–17.0)
MCH: 29.4 pg (ref 26.0–34.0)
MCHC: 33.7 g/dL (ref 30.0–36.0)
RDW: 13.5 % (ref 11.5–15.5)

## 2012-06-28 LAB — BASIC METABOLIC PANEL
BUN: 11 mg/dL (ref 6–23)
Creatinine, Ser: 1.15 mg/dL (ref 0.50–1.35)
GFR calc non Af Amer: 75 mL/min — ABNORMAL LOW (ref >90)
Glucose, Bld: 86 mg/dL (ref 70–99)
Potassium: 4.3 mEq/L (ref 3.5–5.1)

## 2012-06-28 MED ORDER — METRONIDAZOLE 500 MG PO TABS: 500.0000 mg | ORAL_TABLET | Freq: Three times a day (TID) | ORAL | Status: AC

## 2012-06-28 MED ORDER — CIPROFLOXACIN HCL 500 MG PO TABS: 500.0000 mg | ORAL_TABLET | Freq: Two times a day (BID) | ORAL | Status: AC

## 2012-06-28 MED ORDER — ONDANSETRON HCL 4 MG PO TABS: 4.0000 mg | ORAL_TABLET | Freq: Four times a day (QID) | ORAL | Status: DC | PRN

## 2012-06-28 NOTE — Discharge Summary (Signed)
Physician Discharge Summary  Gary Duke ZOX:[id number] DOB: May 09, 1965 DOA: 06/26/2012  PCP: Carollee Herter, MD  Admit date: 06/26/2012 Discharge date: 06/28/2012  Time spent: 60 minutes  Recommendations for Outpatient Follow-up:  1. Patient is to followup with PCP one week post discharge. On followup the patient will need a basic metabolic profile done to followup on his electrolytes and renal function.  Discharge Diagnoses:  Principal Problem:   Abdominal  pain, other specified site Active Problems:   Polycystic kidney disease   Enteritis   Hypokalemia   Dehydration   Discharge Condition: Stable and improved  Diet recommendation: Regular  Filed Weights   06/26/12 0825 06/26/12 0852  Weight: 70.308 kg (155 lb) 69.446 kg (153 lb 1.6 oz)    History of present illness:  Gary Duke is a 47 y.o. male  This is a 47 year old male with a history of polycystic kidney disease who presents with a complaint of abdominal pain. The patient was finishing off from work when he noted the pain close to 12 midnight. The pain is diffuse across the abdomen and at one point radiated around to his back for a couple of minutes. He describes it as crampy and burning about 8/10 in severity. He's not aware of any exacerbating factors. Pain improved with pain medication in the ER. He did have some nausea and vomited a small amount of light green liquid last night. He has not had any further vomiting and was able to drink the contrast given to him for a CT scan of the abdomen - has not had any diarrhea. No complaints of fevers chills or sweats either. He does not recall eating anything out of the ordinary-he usually takes frozen foods to eat at work.   Hospital Course:  #1 abdominal pain  Patient had presented with complaints of abdominal pain as well as some emesis and nausea. Patient denied any diarrhea. It was felt this was likely secondary to an enteritis. CT of the abdomen and pelvis which  was done on admission showed thickened jejunal loops. Patient was placed empirically on IV ciprofloxacin and Flagyl as well as supportive care and IV fluids. Patient improved clinically he was subsequently started on a clear liquid diet which was advanced to a regular diet. Patient tolerated diet did not have any further abdominal pain and was subsequently discharged home on 8 days of oral Flagyl and ciprofloxacin. Patient is to followup with PCP as outpatient. #2 enteritis   See patient patient was noted to be hypokalemic which was felt secondary to GI losses.  #3 hypokalemia   Patient's potassium was repleted and had resolved by day of discharge.   #4 dehydration   on admission patient was noted to be dehydrated patient was hydrated with IV fluids and was euvolemic by day of discharge.     Procedures: CT of the abdomen and pelvis 06/26/2012   Consultations:  None  Discharge Exam: Filed Vitals:   06/27/12 1352 06/27/12 2100 06/28/12 0500 06/28/12 1402  BP: 123/73 115/70 133/64 110/68  Pulse: 66 66 66 77  Temp: 98.4 F (36.9 C) 97.6 F (36.4 C) 98.1 F (36.7 C) 97.9 F (36.6 C)  TempSrc: Oral Oral Oral Oral  Resp: 20 18 16 18   Height:      Weight:      SpO2: 100% 100% 100% 98%    General: NAD Cardiovascular: RRR Respiratory: CTAB  Discharge Instructions  Discharge Orders   Future Orders Complete By Expires  Diet general  As directed     Discharge instructions  As directed     Comments:      fOLLOW UP WITH Carollee Herter, MD IN 1 WEEK.    Increase activity slowly  As directed         Medication List    TAKE these medications       ciprofloxacin 500 MG tablet  Commonly known as:  CIPRO  Take 1 tablet (500 mg total) by mouth 2 (two) times daily. TAKE FOR 8 DAYS.     ibuprofen 200 MG tablet  Commonly known as:  ADVIL,MOTRIN  Take 200 mg by mouth every 6 (six) hours as needed for pain.     metroNIDAZOLE 500 MG tablet  Commonly known as:  FLAGYL   Take 1 tablet (500 mg total) by mouth 3 (three) times daily. TAKE FOR 8 DAYS THEN STOP.     ondansetron 4 MG tablet  Commonly known as:  ZOFRAN  Take 1 tablet (4 mg total) by mouth every 6 (six) hours as needed for nausea.           Follow-up Information   Follow up with Carollee Herter, MD. Schedule an appointment as soon as possible for a visit in 1 week.   Contact information:   [address] [place] [address] [phone]        The results of significant diagnostics from this hospitalization (including imaging, microbiology, ancillary and laboratory) are listed below for reference.    Significant Diagnostic Studies: Ct Abdomen Pelvis W Contrast  06/26/2012  *RADIOLOGY REPORT*  Clinical Data: Severe abdominal pain  CT ABDOMEN AND PELVIS WITH CONTRAST  Technique:  Multidetector CT imaging of the abdomen and pelvis was performed following the standard protocol during bolus administration of intravenous contrast.  Contrast: OMNIPAQUE IOHEXOL 300 MG/ML  SOLN  Comparison: 06/26/2012 radiographs  Findings: Lung bases clear.  Heart size within normal range.  Multiple hypodensities scattered throughout the liver, favored to reflect cysts.  Unremarkable biliary system, spleen, adrenal glands.  Punctate calcification along the body of the pancreas may reflect sequelae of prior pancreatitis.  Homogeneous pancreatic enhancement.  No peripancreatic fat stranding at this time to suggest acute inflammation.  There are numerous bilateral renal cysts and incompletely characterize hypodensities.  Calcifications left kidney may reflect stones.  No hydronephrosis or hydroureter.  Decompressed colon.  No CT evidence for colitis.  Normal appendix. There is a small amount of fluid within the right lower quadrant, periappendiceal and right paracolic gutter abutting the cecum/descending colon.  No free intraperitoneal air.  There are thickened jejunal loops.  Decompressed distal bowel.   Normal caliber aorta and branch vessels with scattered atherosclerotic disease.  Patent portal vessels.  Circumferential bladder wall thickening nonspecific.  No acute osseous finding.  IMPRESSION: Thickened jejunal loops may reflect a nonspecific enteritis (infectious, inflammatory, ischemic considerations).  Small amount of free fluid within the right lower quadrant is nonspecific.  The appendix is normal.  Polycystic kidneys and nonobstructing left renal stones.   Original Report Authenticated By: Jearld Lesch, M.D.    Dg Abd Acute W/chest  06/26/2012  *RADIOLOGY REPORT*  Clinical Data: Mid abdominal pain  ACUTE ABDOMEN SERIES (ABDOMEN 2 VIEW & CHEST 1 VIEW)  Comparison: 03/13/2006 chest radiograph  Findings: No focal consolidation.  Cardiomediastinal contours within normal range.  Relative paucity of bowel gas.  No free intraperitoneal air.  No acute osseous finding.  IMPRESSION: Nonspecific bowel gas pattern without overt evidence for  obstruction.   Original Report Authenticated By: Jearld Lesch, M.D.     Microbiology: No results found for this or any previous visit (from the past 240 hour(s)).   Labs: Basic Metabolic Panel:  Recent Labs Lab 06/26/12 0250 06/27/12 0549 06/28/12 0417  NA 135 139 135  K 3.3* 4.7 4.3  CL 98 106 101  CO2 24 28 29   GLUCOSE 150* 79 86  BUN 18 14 11   CREATININE 1.04 1.01 1.15  CALCIUM 9.6 9.0 8.8   Liver Function Tests:  Recent Labs Lab 06/26/12 0250  AST 20  ALT 11  ALKPHOS 65  BILITOT 0.7  PROT 6.9  ALBUMIN 3.9    Recent Labs Lab 06/26/12 0250  LIPASE 26   No results found for this basename: AMMONIA,  in the last 168 hours CBC:  Recent Labs Lab 06/26/12 0250 06/27/12 0549 06/28/12 0417  WBC 11.5* 7.0 6.3  NEUTROABS 9.2*  --   --   HGB 14.7 13.0 12.8*  HCT 41.3 38.5* 38.0*  MCV 85.0 87.9 87.2  PLT 237 195 206   Cardiac Enzymes: No results found for this basename: CKTOTAL, CKMB, CKMBINDEX, TROPONINI,  in the last 168  hours BNP: BNP (last 3 results) No results found for this basename: PROBNP,  in the last 8760 hours CBG: No results found for this basename: GLUCAP,  in the last 168 hours     Signed:  Gerritt Galentine  [facility] 06/28/2012, 3:07 PM

## 2012-07-01 ENCOUNTER — Telehealth (HOSPITAL_COMMUNITY): Payer: Self-pay | Admitting: Emergency Medicine

## 2012-07-03 ENCOUNTER — Encounter: Payer: Self-pay | Admitting: Medical

## 2012-07-03 ENCOUNTER — Ambulatory Visit (INDEPENDENT_AMBULATORY_CARE_PROVIDER_SITE_OTHER): Payer: Self-pay | Admitting: Medical

## 2012-07-03 VITALS — BP 130/78 | HR 82 | Temp 98.2°F | Resp 16 | Wt 152.0 lb

## 2012-07-03 DIAGNOSIS — E860 Dehydration: Secondary | ICD-10-CM

## 2012-07-03 DIAGNOSIS — E876 Hypokalemia: Secondary | ICD-10-CM

## 2012-07-03 DIAGNOSIS — K5289 Other specified noninfective gastroenteritis and colitis: Secondary | ICD-10-CM

## 2012-07-03 DIAGNOSIS — Q613 Polycystic kidney, unspecified: Secondary | ICD-10-CM

## 2012-07-03 DIAGNOSIS — K529 Noninfective gastroenteritis and colitis, unspecified: Secondary | ICD-10-CM

## 2012-07-03 LAB — BASIC METABOLIC PANEL
BUN: 11 mg/dL (ref 6–23)
Calcium: 9.4 mg/dL (ref 8.4–10.5)
Glucose, Bld: 82 mg/dL (ref 70–99)
Sodium: 135 mEq/L (ref 135–145)

## 2012-07-03 NOTE — Progress Notes (Signed)
Subjective:  Gary Duke is a 47 y.o. male who presents for hospital follow up.   Was hospitalized recently for dehydration, enteritis, nausea and vomiting .  Was on brief period of IV antibiotics and hydration with fluids, was hospitalized 4/23-4/25/14.   Today feels fine.  Still taking Cipro and Flagyl.  Nausea has been fine, none since hospitalization.  No more diarrhea since leaving hospital.  Currently drinking several glasses of water daily, soda, coffee.  No new c/o.  Needs note to clear him to return to work.  No other aggravating or relieving factors.    The following portions of the patient's history were reviewed and updated as appropriate: allergies, current medications, past family history, past medical history, past social history, past surgical history and problem list.  ROS Otherwise as in subjective above  Past Medical History  Diagnosis Date  . Chronic kidney disease     Objective: Physical Exam  Vital signs reviewed  General appearance: alert, no distress, WD/WN, lean white male Oral cavity: MMM, no lesions, upper denture Eyes conjunctiva pink Neck: supple, no lymphadenopathy, no thyromegaly, no masses Heart: RRR, normal S1, S2, no murmurs Lungs: CTA bilaterally, no wheezes, rhonchi, or rales Abdomen: +bs, soft, non tender, non distended, no masses, no hepatomegaly, no splenomegaly Pulses: 2+ radial pulses, 2+ pedal pulses, normal cap refill Ext: no edema Skin: good turgor   Assessment: Encounter Diagnoses  Name Primary?  . Enteritis Yes  . Hypokalemia   . Dehydration   . Polycystic kidney disease     Plan: reviewed discharge summary.   He seems to be much improved.   Enteritis - finish both cipro and flagyl, 3 more days left  Hypokalemia - resolved by end of hospital stay.   Recheck BMET today  Dehydration - resolved  Polycystic kidney disease - advised routine f/u, at minimal yearly, but when his new insurance kicks in soon, he will return for  physical  Follow up: pending labs.  Note given to return to work Monday, provided not worsening.

## 2012-07-04 NOTE — Progress Notes (Signed)
Quick Note:  CALLED PT LEFT MESSAGE WORD FOR WORD   Labs were fine. Finish antibiotics, hydrate well. Recheck if not continuing to improve.   ______

## 2012-10-08 ENCOUNTER — Ambulatory Visit (INDEPENDENT_AMBULATORY_CARE_PROVIDER_SITE_OTHER): Payer: BC Managed Care – PPO | Admitting: Medical

## 2012-10-08 ENCOUNTER — Encounter: Payer: Self-pay | Admitting: Medical

## 2012-10-08 VITALS — BP 130/90 | HR 100 | Temp 98.2°F | Wt 149.0 lb

## 2012-10-08 DIAGNOSIS — R630 Anorexia: Secondary | ICD-10-CM

## 2012-10-08 DIAGNOSIS — R319 Hematuria, unspecified: Secondary | ICD-10-CM

## 2012-10-08 DIAGNOSIS — R5383 Other fatigue: Secondary | ICD-10-CM

## 2012-10-08 DIAGNOSIS — R5381 Other malaise: Secondary | ICD-10-CM

## 2012-10-08 DIAGNOSIS — K921 Melena: Secondary | ICD-10-CM

## 2012-10-08 DIAGNOSIS — F17200 Nicotine dependence, unspecified, uncomplicated: Secondary | ICD-10-CM

## 2012-10-08 DIAGNOSIS — R634 Abnormal weight loss: Secondary | ICD-10-CM

## 2012-10-08 LAB — CBC WITH DIFFERENTIAL/PLATELET
Eosinophils Relative: 3 % (ref 0–5)
HCT: 44.5 % (ref 39.0–52.0)
Hemoglobin: 15.7 g/dL (ref 13.0–17.0)
Lymphocytes Relative: 24 % (ref 12–46)
Lymphs Abs: 1.9 10*3/uL (ref 0.7–4.0)
MCV: 84.9 fL (ref 78.0–100.0)
Monocytes Absolute: 0.6 10*3/uL (ref 0.1–1.0)
Monocytes Relative: 8 % (ref 3–12)
RBC: 5.24 MIL/uL (ref 4.22–5.81)
WBC: 8.1 10*3/uL (ref 4.0–10.5)

## 2012-10-08 LAB — POCT URINALYSIS DIPSTICK
Bilirubin, UA: NEGATIVE
Ketones, UA: NEGATIVE
Leukocytes, UA: NEGATIVE
Protein, UA: NEGATIVE

## 2012-10-08 LAB — COMPREHENSIVE METABOLIC PANEL
ALT: 11 U/L (ref 0–53)
Alkaline Phosphatase: 69 U/L (ref 39–117)
CO2: 29 mEq/L (ref 19–32)
Potassium: 4.2 mEq/L (ref 3.5–5.3)
Sodium: 137 mEq/L (ref 135–145)
Total Bilirubin: 0.6 mg/dL (ref 0.3–1.2)
Total Protein: 7.2 g/dL (ref 6.0–8.3)

## 2012-10-08 NOTE — Progress Notes (Signed)
Subjective Here for ongoing complaints. Staying exhausted all the time.  Works 10 hours daily.  Feels like he has no energy.  Been feeling this way for a year now.  Has not had normal appetite for a while.  Left leg throbs when she stands on it extended periods.  Lately has been having some loose stools at least once daily.  Has seen blood on toilet paper.  Denies mucous in the stool.   He also has seen blood in urine at times.  Younger brother has had similar fatigue, was diagnosed with thyroid issues and low testosterone.     Review of Systems Constitutional: -fever, -chills, -sweats, +losing weight,+fatigue ENT: -runny nose, -ear pain, -sore throat Cardiology:  -chest pain, -palpitations, -edema Respiratory: +occasional smoker cough, +shortness of breath, +sometimes wheezing Gastroenterology: -abdominal pain, +nausea, -vomiting, +diarrhea, -constipation  Hematology: -bleeding or bruising problems Musculoskeletal: -arthralgias, -myalgias, -joint swelling, -back pain Ophthalmology: +gradual difficulty with vision as he has gotten older Urology: -dysuria, -difficulty urinating, +hematuria, -urinary frequency, -urgency Neurology: -headache, -weakness, -tingling, -numbness   The following portions of the patient's history were reviewed and updated as appropriate: allergies, current medications, past family history, past medical history, past social history, past surgical history and problem list.    Past Medical History  Diagnosis Date  . Chronic kidney disease     Objective: Physical Exam  Vital signs reviewed  General appearance: alert, no distress, WD/WN, lean white male Oral cavity: MMM, no lesions, upper denture HEENT: conjunctiva pink, TMs pearly, nares patent, pharynx normal Oral: no lesions, MMM Neck: supple, no lymphadenopathy, no thyromegaly, no masses Heart: RRR, normal S1, S2, no murmurs Lungs: CTA bilaterally, no wheezes, rhonchi, or rales Abdomen: +bs, soft, non  tender, non distended, no masses, no hepatomegaly, no splenomegaly Pulses: 2+ radial pulses, 2+ pedal pulses, normal cap refill Ext: no edema Skin: good turgor Neuro: nonfocal   Assessment: Encounter Diagnoses  Name Primary?  . Other malaise and fatigue Yes  . Loss of weight   . Anorexia   . Tobacco use disorder   . Blood in urine   . Blood in stool     Plan: Pending labs, consider referral for colonoscopy, urology.  Reviewed CT abdomen/pelvis and CXR from last visit.  Etiology unclear.

## 2012-10-09 ENCOUNTER — Other Ambulatory Visit: Payer: Self-pay | Admitting: Medical

## 2012-10-09 ENCOUNTER — Telehealth: Payer: Self-pay | Admitting: Medical

## 2012-10-09 DIAGNOSIS — F17200 Nicotine dependence, unspecified, uncomplicated: Secondary | ICD-10-CM

## 2012-10-09 DIAGNOSIS — R5381 Other malaise: Secondary | ICD-10-CM

## 2012-10-11 NOTE — Telephone Encounter (Signed)
Sexual dysfunction is a separate issue from the other things we have discussed.  His testosterone was normal recently.  To discuss further, have him come in fasting to check his cholesterol, I'll need to recheck his urine and do a prostate and genital exam, and then we can discuss symptoms and possible treatments.

## 2012-10-11 NOTE — Telephone Encounter (Signed)
I left a detailed message on the patients voicemail. CLS 

## 2012-10-14 ENCOUNTER — Telehealth: Payer: Self-pay | Admitting: Family Medicine

## 2012-10-14 ENCOUNTER — Encounter: Payer: Self-pay | Admitting: Gastroenterology

## 2012-10-14 NOTE — Telephone Encounter (Signed)
Patient is aware of his appointment to see Dr. Jarold Motto at [facility] on 10/29/12 @ 200 pm. CLS Patient is also aware of his appointment for his PFT  On 11/12/12 @ 400 pm. CLS [phone]

## 2012-10-16 ENCOUNTER — Encounter: Payer: Self-pay | Admitting: Medical

## 2012-10-16 ENCOUNTER — Ambulatory Visit (INDEPENDENT_AMBULATORY_CARE_PROVIDER_SITE_OTHER): Payer: BC Managed Care – PPO | Admitting: Medical

## 2012-10-16 VITALS — BP 118/78 | HR 88 | Temp 98.2°F | Resp 14 | Wt 149.0 lb

## 2012-10-16 DIAGNOSIS — N529 Male erectile dysfunction, unspecified: Secondary | ICD-10-CM

## 2012-10-16 DIAGNOSIS — F17200 Nicotine dependence, unspecified, uncomplicated: Secondary | ICD-10-CM

## 2012-10-16 NOTE — Patient Instructions (Signed)
Begin trial of Viagra 50mg .  Use 1/2 tablet as needed. No more than 1 tablet daily.   Try the 1/2 tablet on several different days to see how this works.     If this causes headache as you had in the past, we willl have to use a different medication.  If you take the medication and get dizzy, lightheaded, faint feeling, lie down, and get help - including calling us or 911, and drinking some water.   Sildenafil tablets (Viagra) What is this medicine? SILDENAFIL (sil DEN a fil) is used to treat erection problems in men. This medicine may be used for other purposes; ask your health care provider or pharmacist if you have questions. What should I tell my health care provider before I take this medicine? They need to know if you have any of these conditions: -bleeding disorders -eye or vision problems, including a rare inherited eye disease called retinitis pigmentosa -anatomical deformation of the penis, Peyronie's disease, or history of priapism (painful and prolonged erection) -heart disease, angina, a history of heart attack, irregular heart beats, or other heart problems -high or low blood pressure -history of blood diseases, like sickle cell anemia or leukemia -history of stomach bleeding -kidney disease -liver disease -stroke -an unusual or allergic reaction to sildenafil, other medicines, foods, dyes, or preservatives -pregnant or trying to get pregnant -breast-feeding How should I use this medicine? Take this medicine by mouth with a glass of water. Follow the directions on the prescription label. The dose is usually taken 1 hour before sexual activity. You should not take the dose more than once per day. Do not take your medicine more often than directed. Talk to your pediatrician regarding the use of this medicine in children. This medicine is not used in children for this condition. Overdosage: If you think you have taken too much of this medicine contact a poison control center  or emergency room at once. NOTE: This medicine is only for you. Do not share this medicine with others. What if I miss a dose? This does not apply. Do not take double or extra doses. What may interact with this medicine? Do not take this medicine with any of the following medications: -cisapride -methscopolamine nitrate -nitrates like amyl nitrite, isosorbide dinitrate, isosorbide mononitrate, nitroglycerin -nitroprusside -other medicines for erectile dysfunction like avanafil, tadalafil, vardenafil -other sildenafil products (Revatio)  This medicine may also interact with the following medications: -certain drugs for high blood pressure -certain drugs for the treatment of HIV infection or AIDS -certain drugs used for fungal or yeast infections, like fluconazole, itraconazole, ketoconazole, and voriconazole -cimetidine -erythromycin -rifampin This list may not describe all possible interactions. Give your health care provider a list of all the medicines, herbs, non-prescription drugs, or dietary supplements you use. Also tell them if you smoke, drink alcohol, or use illegal drugs. Some items may interact with your medicine. What should I watch for while using this medicine? If you notice any changes in your vision while taking this drug, call your doctor or health care professional as soon as possible. Stop using this medicine and call your health care provider right away if you have a loss of sight in one or both eyes. Contact you doctor or health care professional right away if the erection lasts longer than 4 hours or if it becomes painful. This may be a sign of serious problem and must be treated right away to prevent permanent damage. If you experience symptoms of nausea, dizziness, chest  pain or arm pain upon initiation of sexual activity after taking this medicine, you should refrain from further activity and call your doctor or health care professional as soon as possible. Do not  drink alcohol to excess (examples, 5 glasses of wine or 5 shots of whiskey) when taking this medicine. When taken in excess, alcohol can increase your chances of getting a headache or getting dizzy, increasing your heart rate or lowering your blood pressure. Using this medicine does not protect you or your partner against HIV infection (the virus that causes AIDS) or other sexually transmitted diseases. What side effects may I notice from receiving this medicine? Side effects that you should report to your doctor or health care professional as soon as possible: -allergic reactions like skin rash, itching or hives, swelling of the face, lips, or tongue -breathing problems -changes in hearing -changes in vision -chest pain -erection lasting more than 4 hours -fast, irregular heartbeat -seizures  Side effects that usually do not require medical attention (report to your doctor or health care professional if they continue or are bothersome): -back pain -dizziness -flushing -headache -indigestion -muscle aches -nausea -stuffy or runny nose This list may not describe all possible side effects. Call your doctor for medical advice about side effects. You may report side effects to FDA at 1-800-FDA-1088. Where should I keep my medicine? Keep out of reach of children. Store at room temperature between 15 and 30 degrees C (59 and 86 degrees F). Throw away any unused medicine after the expiration date. NOTE: This sheet is a summary. It may not cover all possible information. If you have questions about this medicine, talk to your doctor, pharmacist, or health care provider.  2012, Elsevier/Gold Standard. (08/16/2010 1:17:06 PM)     Erectile Dysfunction Erectile dysfunction (ED) is the inability to get a good enough erection to have sexual intercourse. ED may involve:  Inability to get an erection.  Lack of enough hardness to allow penetration.  Loss of the erection before sex is  finished.  Premature ejaculation.  Any combination of these problems if they occur more than 25% of the time. CAUSES  Certain drugs, such as:  Pain relievers.  Antihistamines.  Antidepressants.  Blood pressure medicines.  Water pills.  Ulcer medicines.  Muscle relaxants.  Illegal drugs.  Excessive drinking.  Psychological causes, such as:  Anxiety.  Depression.  Sadness.  Exhaustion.  Performance fear.  Stress.  Physical causes, such as:  Artery problems. This may include diabetes, smoking, liver disease, or atherosclerosis.  High blood pressure.  Hormonal problems, such as low testosterone.  Obesity.  Nerve problems. This may include back or pelvic injuries, diabetes, multiple sclerosis, Parkinson's disease, or some surgeries. SYMPTOMS  Inability to get an erection.  Lack of enough hardness to allow penetration.  Loss of the erection before sex is finished.  Premature ejaculation.  Normal erections at some times, but with frequent unsatisfactory episodes.  Orgasms that are not satisfactory in sensation or frequency.  Low sexual satisfaction in either partner because of erection problems.  A curved penis occurring with erection. The curve may cause pain or may be too curved to allow for intercourse.  Never having nighttime erections. DIAGNOSIS Your caregiver can often diagnose this condition by:  Performing a physical exam to find other diseases or specific problems with the penis.  Asking you detailed questions about the problem.  Performing blood tests to check for diabetes or to measure hormone levels.  Performing urine tests to find other  underlying health conditions.  Performing an ultrasound to check for scarring.  Performing a test to check blood flow to the penis.  Doing a sleep study at home to measure nighttime erections. TREATMENT   You may be prescribed medicines by mouth.  You may be given medicine injections into  the penis.  You may be prescribed a vacuum pump with a ring.  Penile implant surgery may be performed. You may receive:  An inflatable implant.  A semi-rigid implant.  Blood vessel surgery may be performed. HOME CARE INSTRUCTIONS  Take all medicine as directed by your caregiver. Do not take any other medicines without talking to your caregiver first.  Follow your caregiver's directions for specific treatments as prescribed.  Follow up with your caregiver as directed. Document Released: 02/18/2000 Document Revised: 05/15/2011 Document Reviewed: 06/12/2010 [facility] Patient Information 2014 [place], [place].

## 2012-10-16 NOTE — Progress Notes (Signed)
Subjective:     Gary Duke is a 47 y.o. male here for evaluation and treatment of erectile dysfunction. I saw him recently for multiple concerns including anorexia, nausea, weight loss.  He is pending PFTs, GI consult, and possibly will need Urology consult given some reported hx/o visible hematuria, intermittent.  He is a smoker.     Onset of erectile dysfunction was 5 years ago and onset was gradual. Patient states he has difficulty both attaining and maintaining erection. Full erections occur with intercourse and sometimes. Partial erections occur with intercourse. Libido is not affected. Risk factors for ED include smoker. Patient denies history of antihypertensive medications: none, cranial, spinal, or pelvic trauma, diabetes mellitus, hypogonadism, pelvic radiation and urologic disease: none.  Patient describes relationship with partner as good. Previous treatment of ED includes OTC medications with occasional benefit.    The following portions of the patient's history were reviewed and updated as appropriate: allergies, current medications, past family history, past medical history, past social history, past surgical history and problem list.  ROS as in subjective   Objective:   Gen: wd, wn, nad Heart: RRR, normal S1, S2, no murmurs Pulses normam GU: normal male external genitalia, circumcised, no nodules, no mass, no hernia    Assessment:   Encounter Diagnoses  Name Primary?  . Erectile dysfunction Yes  . Tobacco use disorder     Plan:    Reviewed pathophysiology and differential diagnosis of erectile dysfunction with the patient. Treatment options, including relative risks and benefits, were discussed. All questions were answered. Discontinue potentially causitive medications. Evaluation:  lipid level.   we just completed recent panel of labs including testosterone which was normal. Trial of Viagra; no contraindications.  Advised smoking cessation.

## 2012-10-17 LAB — LIPID PANEL
Cholesterol: 186 mg/dL (ref 0–200)
Total CHOL/HDL Ratio: 4 Ratio

## 2012-10-25 ENCOUNTER — Encounter: Payer: Self-pay | Admitting: *Deleted

## 2012-10-29 ENCOUNTER — Ambulatory Visit: Payer: BC Managed Care – PPO | Admitting: Gastroenterology

## 2012-11-15 ENCOUNTER — Other Ambulatory Visit: Payer: Self-pay | Admitting: Medical

## 2012-11-15 ENCOUNTER — Telehealth: Payer: Self-pay | Admitting: Medical

## 2012-11-15 MED ORDER — SILDENAFIL CITRATE 100 MG PO TABS: 50.0000 mg | ORAL_TABLET | Freq: Every day | ORAL | Status: DC | PRN

## 2012-11-15 NOTE — Telephone Encounter (Signed)
i sent Viagra 100mg  tablets.  Thus, have him cut these in half to equal 50mg .

## 2012-11-19 ENCOUNTER — Telehealth: Payer: Self-pay | Admitting: Medical

## 2012-12-02 NOTE — Telephone Encounter (Signed)
LM

## 2013-01-09 ENCOUNTER — Telehealth: Payer: Self-pay | Admitting: Medical

## 2013-01-09 NOTE — Telephone Encounter (Signed)
lm

## 2013-01-17 ENCOUNTER — Telehealth: Payer: Self-pay | Admitting: Medical

## 2013-01-17 NOTE — Telephone Encounter (Signed)
lm

## 2013-02-03 ENCOUNTER — Telehealth: Payer: Self-pay | Admitting: Medical

## 2013-02-03 NOTE — Telephone Encounter (Signed)
If we have a few Viagra samples, then can give him a pack

## 2013-02-04 ENCOUNTER — Other Ambulatory Visit: Payer: Self-pay | Admitting: Family Medicine

## 2013-02-04 MED ORDER — SILDENAFIL CITRATE 100 MG PO TABS: 50.0000 mg | ORAL_TABLET | Freq: Every day | ORAL | Status: DC | PRN

## 2013-02-04 NOTE — Telephone Encounter (Signed)
I called and left the patient a message about the medication samples. CLS

## 2013-02-21 ENCOUNTER — Telehealth: Payer: Self-pay | Admitting: Medical

## 2013-02-21 MED ORDER — SILDENAFIL CITRATE 100 MG PO TABS: 50.0000 mg | ORAL_TABLET | Freq: Every day | ORAL | Status: DC | PRN

## 2013-02-21 MED ORDER — SILDENAFIL CITRATE 50 MG PO TABS: 50.0000 mg | ORAL_TABLET | Freq: Every day | ORAL | Status: DC | PRN

## 2013-02-21 NOTE — Telephone Encounter (Signed)
lm

## 2013-03-07 ENCOUNTER — Telehealth: Payer: Self-pay | Admitting: Medical

## 2013-03-07 ENCOUNTER — Other Ambulatory Visit: Payer: Self-pay | Admitting: Family Medicine

## 2013-03-07 MED ORDER — SILDENAFIL CITRATE 50 MG PO TABS: 50.0000 mg | ORAL_TABLET | Freq: Every day | ORAL | Status: AC | PRN

## 2013-03-07 NOTE — Telephone Encounter (Signed)
SAMPLES ARE UP FRONT THE PATIENT. CLS

## 2013-03-07 NOTE — Telephone Encounter (Signed)
Give a sample pack of his dose if we have them

## 2013-03-18 ENCOUNTER — Telehealth: Payer: Self-pay | Admitting: Medical

## 2013-03-18 NOTE — Telephone Encounter (Signed)
error 

## 2013-03-26 ENCOUNTER — Telehealth: Payer: Self-pay | Admitting: Medical

## 2013-03-26 NOTE — Telephone Encounter (Signed)
Looking over last visit I don't see any of the recommendations are done.  1) has he had the colonoscopy? 2) do we have PFTs? 3) i need him to repeat clean catch urinalysis due to blood in urine.  (nurse - need micro too)

## 2013-03-27 NOTE — Telephone Encounter (Signed)
Patient had a PFT appointment on 11/12/12 @ 4 pm and he was a NO SHOW. He had an appointment to see Dr. Jarold MottoPatterson on 10/29/12 @ 2 pm (GI) he was a NO SHOW. CLS

## 2013-03-27 NOTE — Telephone Encounter (Signed)
Last saw the patient there were several concerns including weight loss and blood in urine. He needs to get these things resolve before we continue prescriptions. Please give him the phone number so he can call and reschedule for the colonoscopy visit, and he needs to come back for followup on hematuria.  We can't ignore the concerns/abnormalities.

## 2013-03-28 NOTE — Telephone Encounter (Signed)
Patient is aware of Gary CoveyShane Tysinger PA-C message and recommendations. CLS

## 2014-01-15 IMAGING — CT CT ABD-PELV W/ CM
1 of 3 series · 14 of 32 positions shown, 19 images · IV contrast (100 ML OMNI 300)
Comparison: 06/26/2012 radiographs

CLINICAL DATA: Severe abdominal pain

CT ABDOMEN AND PELVIS WITH CONTRAST
TECHNIQUE: Multidetector CT imaging of the abdomen and pelvis was
performed following the standard protocol during bolus
administration of intravenous contrast.
Contrast: 100mL OMNIPAQUE IOHEXOL 300 MG/ML  SOLN

[Series 2: abd/pel with · axial · 0.64mm/px · z∈[+1251,+1611]mm · 14 of 82 slices shown, 19 images]
[im 5/82  soft-tissue]
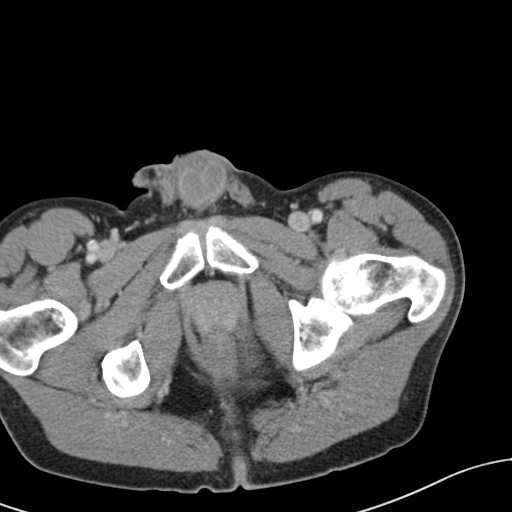
[im 5/82  bone]
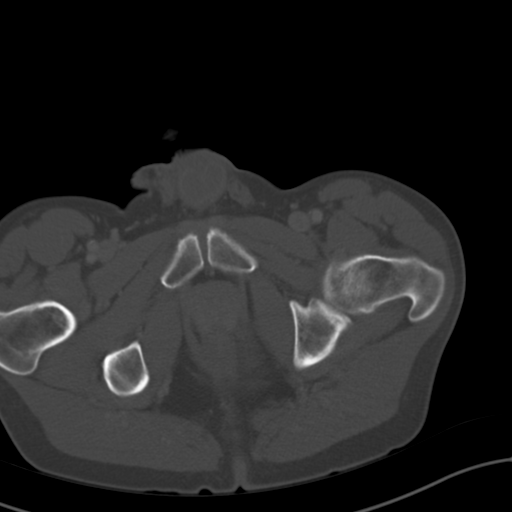
[im 13/82  soft-tissue]
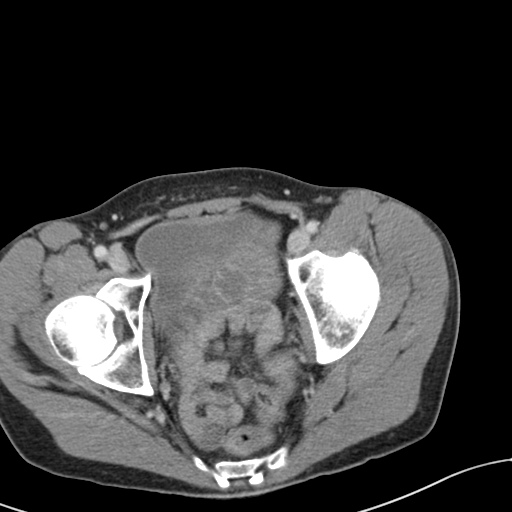
[im 18/82  soft-tissue]
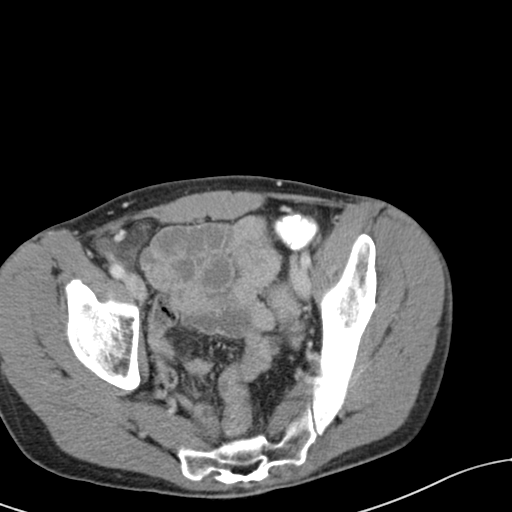
[im 22/82  soft-tissue]
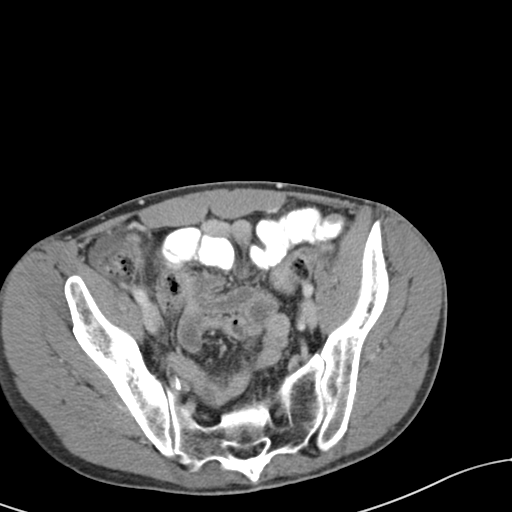
[im 30/82  soft-tissue]
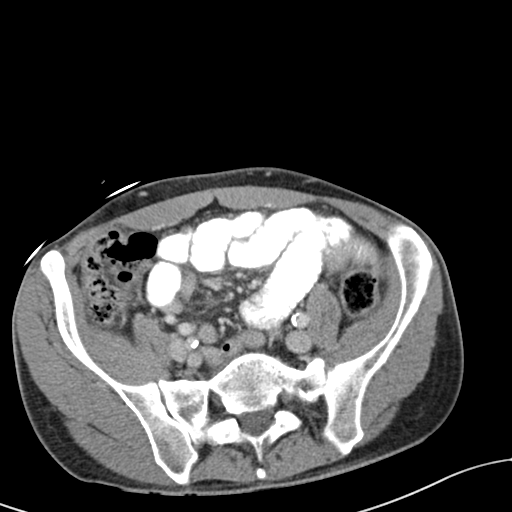
[im 35/82  soft-tissue]
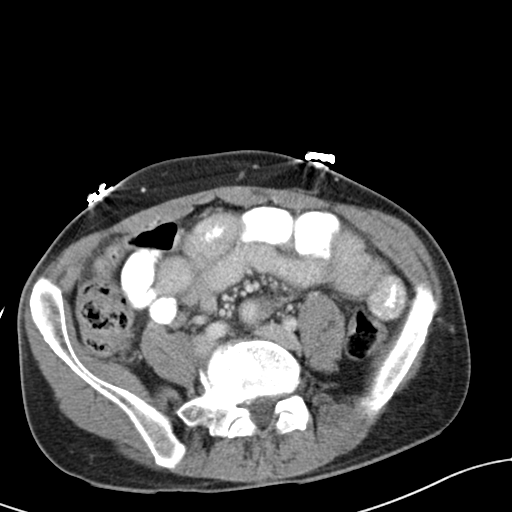
[im 43/82  soft-tissue]
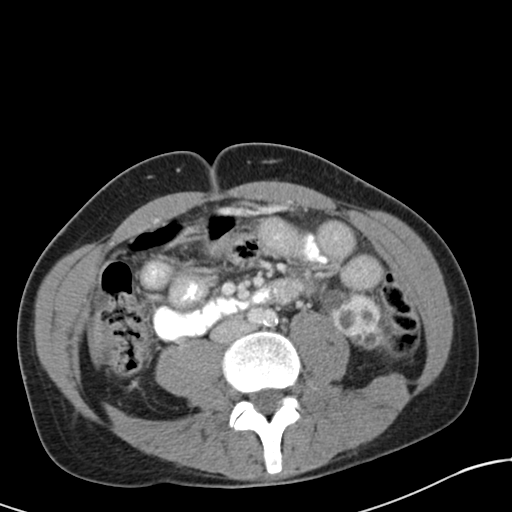
[im 47/82  soft-tissue]
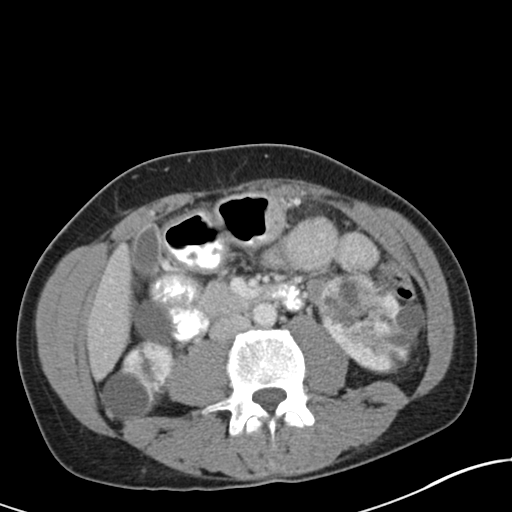
[im 52/82  soft-tissue]
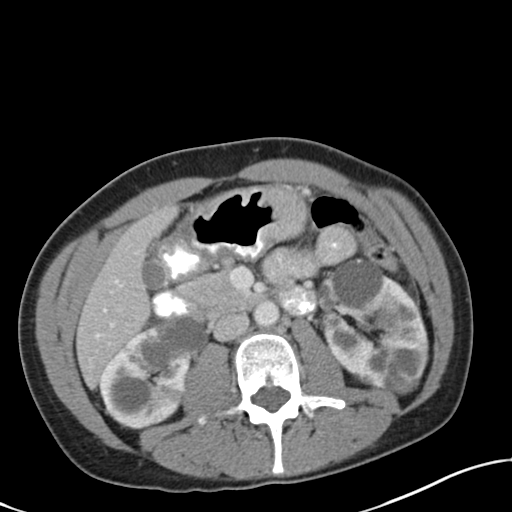
[im 52/82  bone]
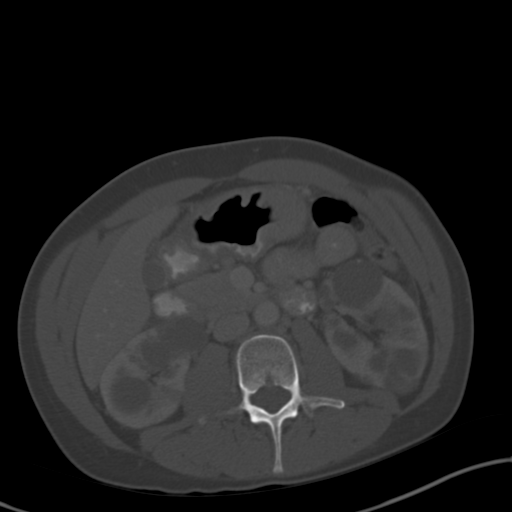
[im 60/82  soft-tissue]
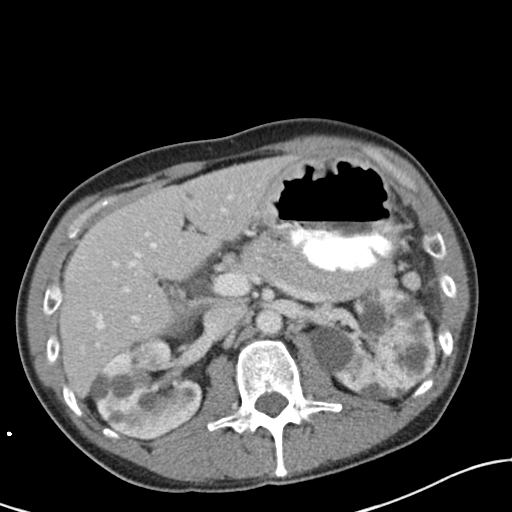
[im 64/82  soft-tissue]
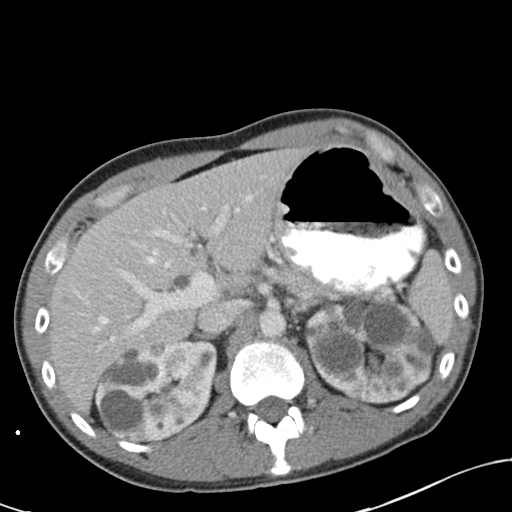
[im 64/82  lung]
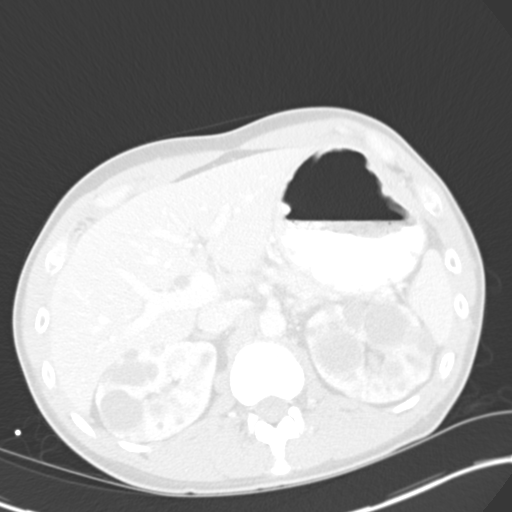
[im 69/82  soft-tissue]
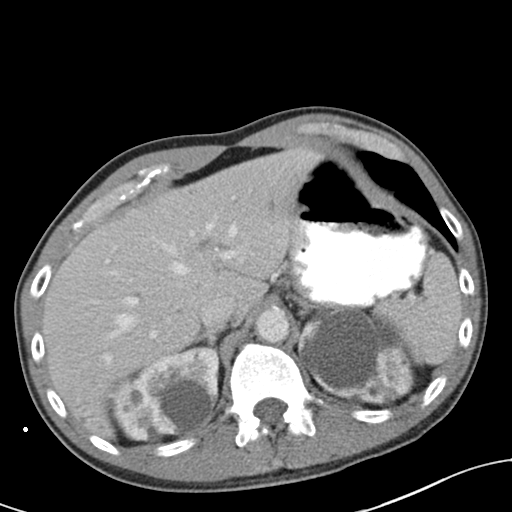
[im 69/82  lung]
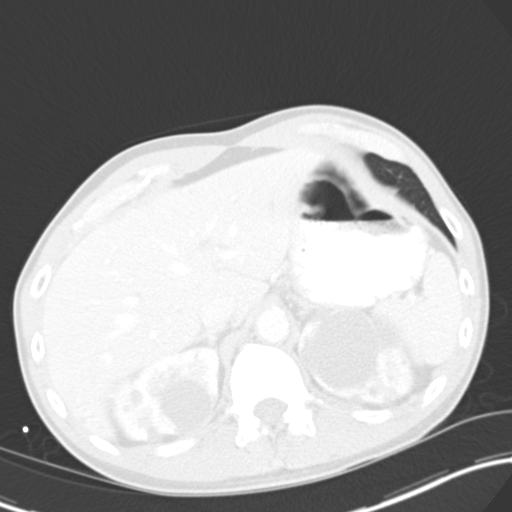
[im 73/82  lung]
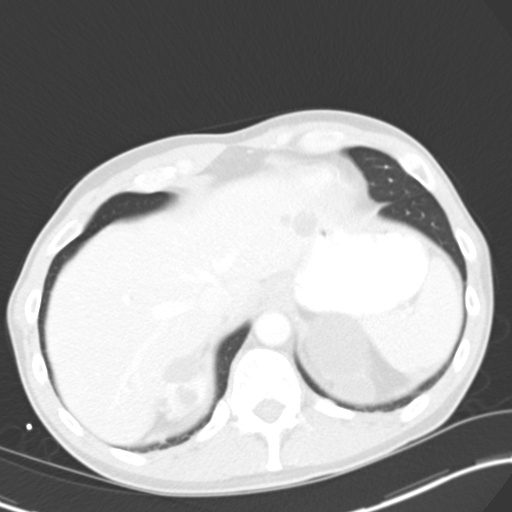
[im 77/82  soft-tissue]
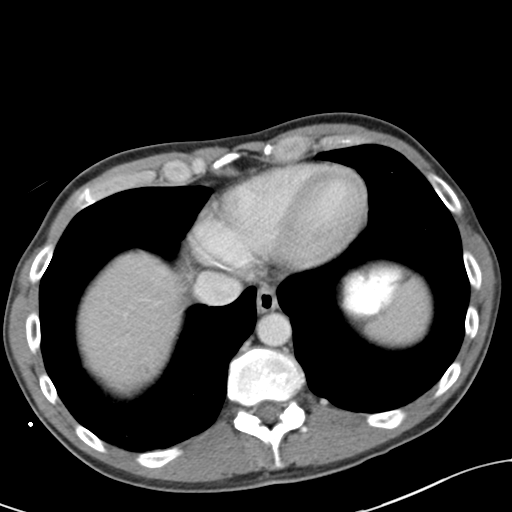
[im 77/82  lung]
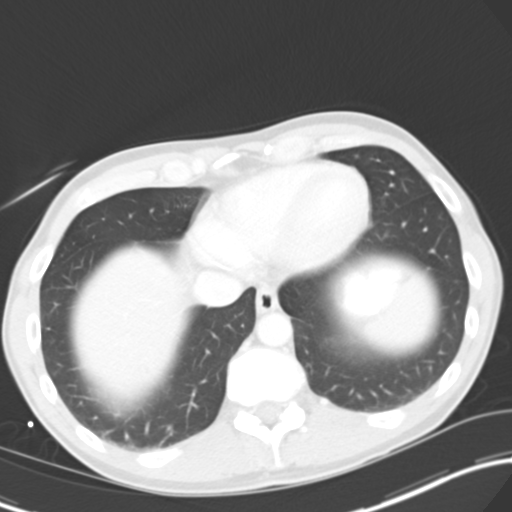

[14 of 32 positions shown; findings below may reference images not displayed]

FINDINGS: Lung bases clear.  Heart size within normal range.

Multiple hypodensities scattered throughout the liver, favored to
reflect cysts.  Unremarkable biliary system, spleen, adrenal
glands.  Punctate calcification along the body of the pancreas may
reflect sequelae of prior pancreatitis.  Homogeneous pancreatic
enhancement.  No peripancreatic fat stranding at this time to
suggest acute inflammation.

There are numerous bilateral renal cysts and incompletely
characterize hypodensities.  Calcifications left kidney may reflect
stones.  No hydronephrosis or hydroureter.

Decompressed colon.  No CT evidence for colitis.  Normal appendix.
There is a small amount of fluid within the right lower quadrant,
periappendiceal and right paracolic gutter abutting the
cecum/descending colon.  No free intraperitoneal air.  There are
thickened jejunal loops.  Decompressed distal bowel.

Normal caliber aorta and branch vessels with scattered
atherosclerotic disease.  Patent portal vessels.

Circumferential bladder wall thickening nonspecific.

No acute osseous finding.
IMPRESSION: Thickened jejunal loops may reflect a nonspecific enteritis
(infectious, inflammatory, ischemic considerations).

Small amount of free fluid within the right lower quadrant is
nonspecific.  The appendix is normal.

Polycystic kidneys and nonobstructing left renal stones.

## 2014-01-15 IMAGING — CR DG ABDOMEN ACUTE W/ 1V CHEST
3 series · 3 of 3 positions shown · non-contrast
Comparison: 03/13/2006 chest radiograph

CLINICAL DATA: Mid abdominal pain

ACUTE ABDOMEN SERIES (ABDOMEN 2 VIEW & CHEST 1 VIEW)

[w abdomen decub]
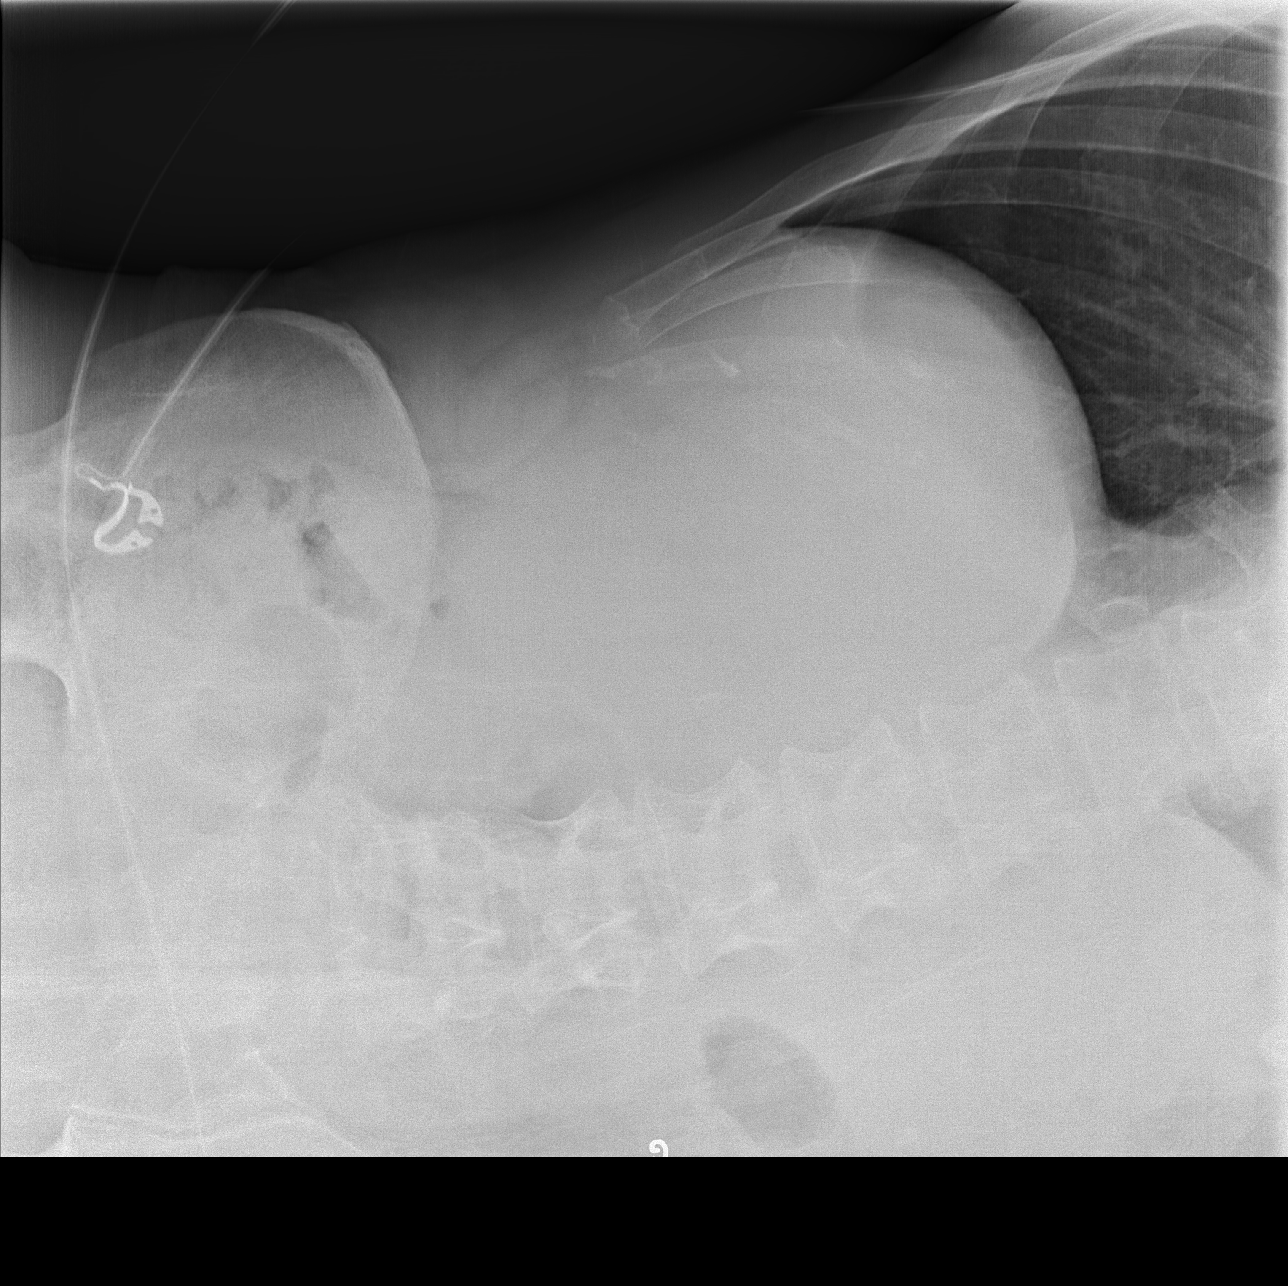

[x abdomen supine]
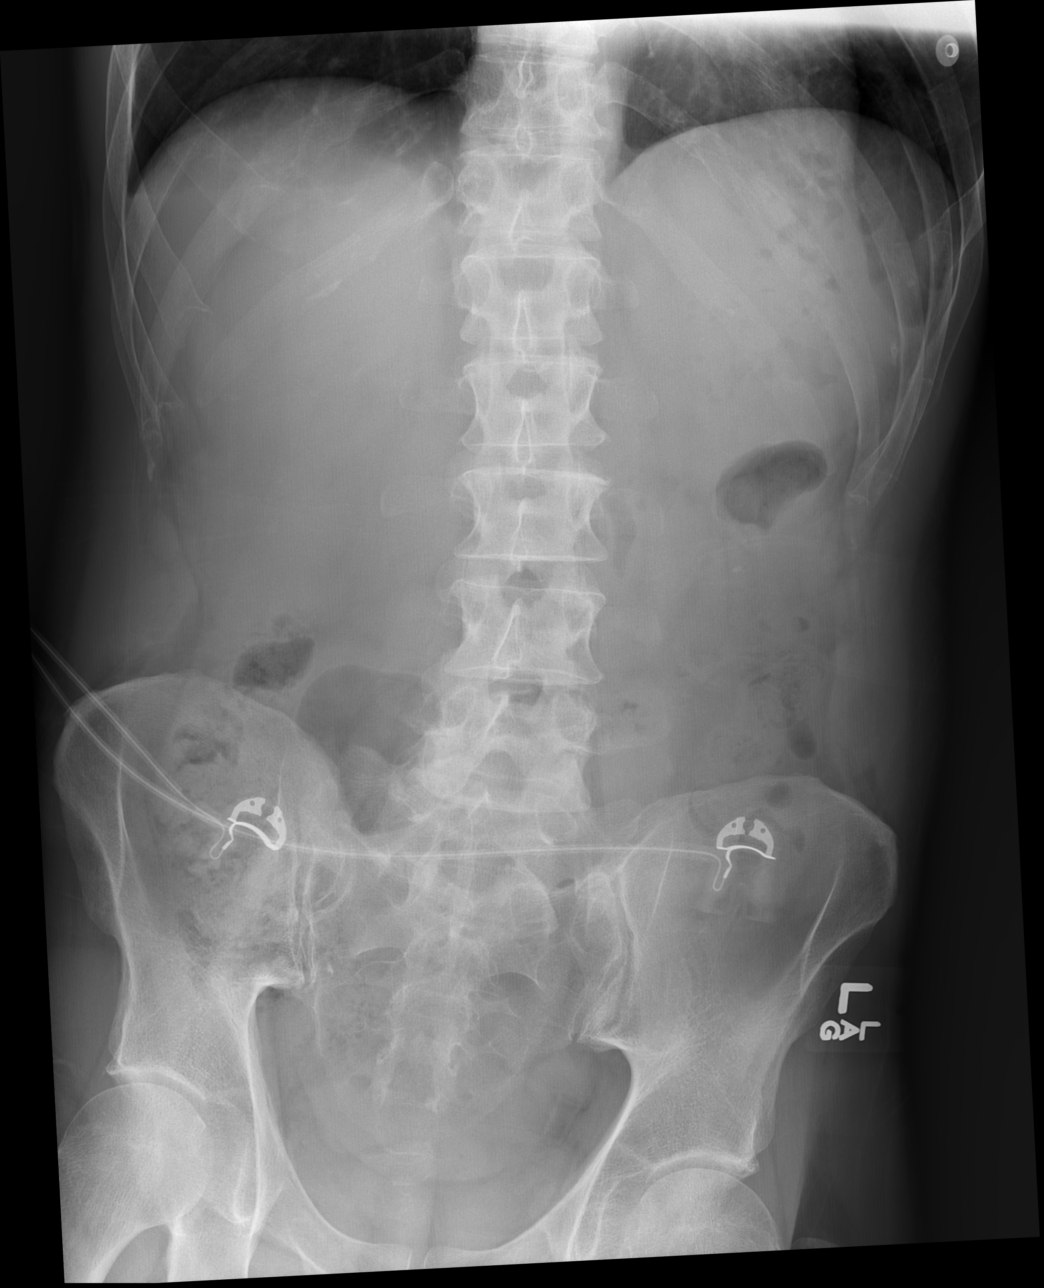

[x chest ap]
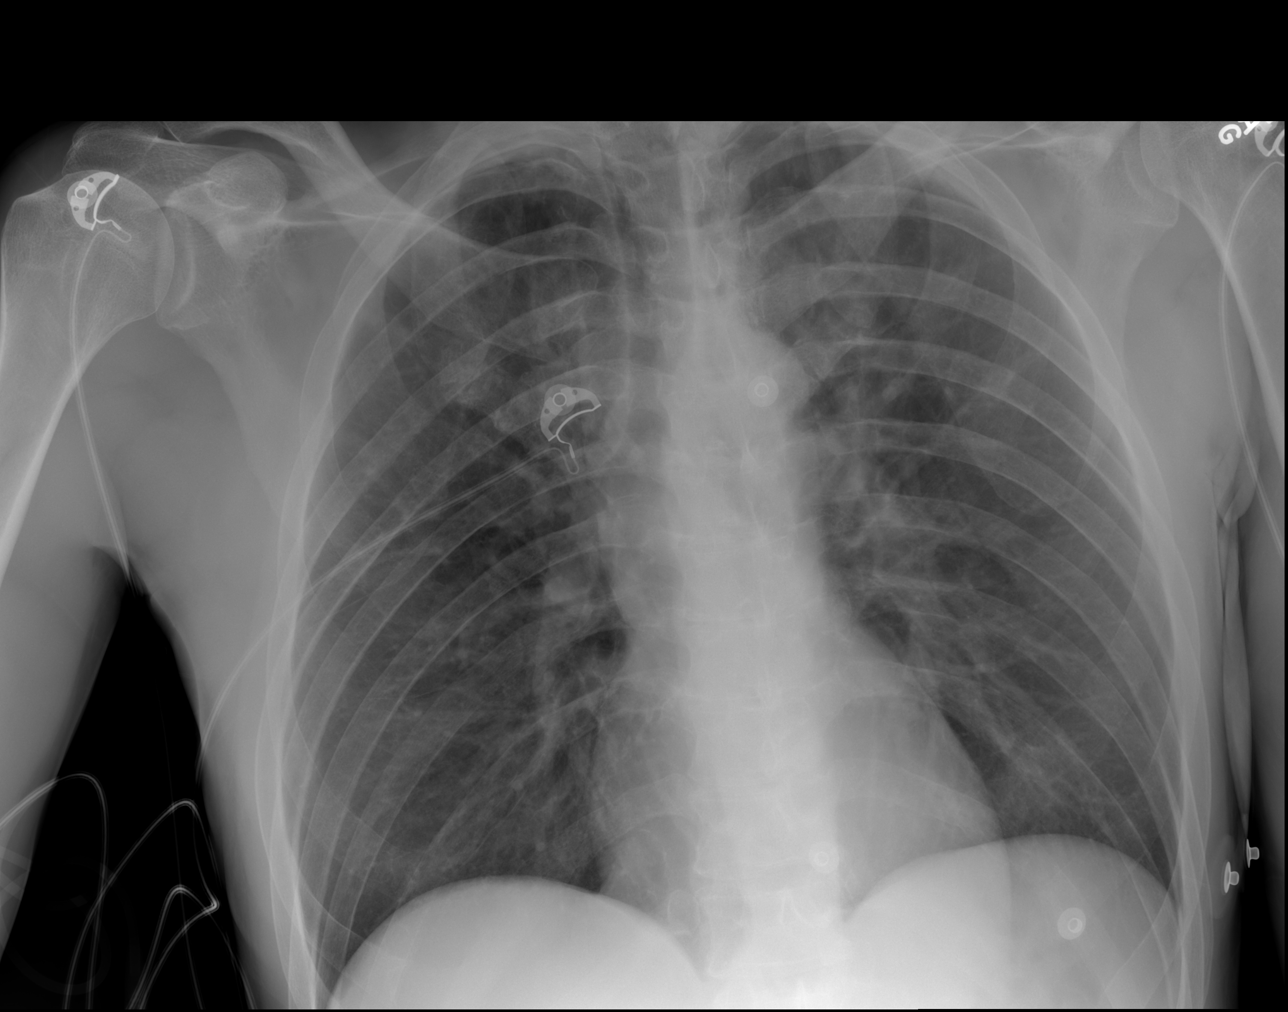

[3 of 3 positions shown; findings below may reference images not displayed]

FINDINGS: No focal consolidation.  Cardiomediastinal contours
within normal range.

Relative paucity of bowel gas.  No free intraperitoneal air.  No
acute osseous finding.
IMPRESSION: Nonspecific bowel gas pattern without overt evidence for
obstruction.
